# Patient Record
Sex: Female | Born: 1965 | Race: White | Hispanic: No | Marital: Married | Smoking: Never smoker
Health system: Southern US, Community
[De-identification: ages and names within clinical notes are randomized; demographics above are authoritative.]

## PROBLEM LIST (undated history)

## (undated) DIAGNOSIS — F419 Anxiety disorder, unspecified: Secondary | ICD-10-CM

## (undated) DIAGNOSIS — K449 Diaphragmatic hernia without obstruction or gangrene: Secondary | ICD-10-CM

## (undated) DIAGNOSIS — L719 Rosacea, unspecified: Secondary | ICD-10-CM

## (undated) DIAGNOSIS — F32A Depression, unspecified: Secondary | ICD-10-CM

## (undated) DIAGNOSIS — E063 Autoimmune thyroiditis: Secondary | ICD-10-CM

## (undated) DIAGNOSIS — K219 Gastro-esophageal reflux disease without esophagitis: Secondary | ICD-10-CM

## (undated) DIAGNOSIS — M1990 Unspecified osteoarthritis, unspecified site: Secondary | ICD-10-CM

## (undated) DIAGNOSIS — R51 Headache: Secondary | ICD-10-CM

## (undated) DIAGNOSIS — E039 Hypothyroidism, unspecified: Secondary | ICD-10-CM

## (undated) DIAGNOSIS — F329 Major depressive disorder, single episode, unspecified: Secondary | ICD-10-CM

## (undated) DIAGNOSIS — K222 Esophageal obstruction: Secondary | ICD-10-CM

## (undated) DIAGNOSIS — K589 Irritable bowel syndrome without diarrhea: Secondary | ICD-10-CM

## (undated) HISTORY — DX: Irritable bowel syndrome, unspecified: K58.9

## (undated) HISTORY — DX: Headache: R51

## (undated) HISTORY — DX: Unspecified osteoarthritis, unspecified site: M19.90

## (undated) HISTORY — DX: Depression, unspecified: F32.A

## (undated) HISTORY — DX: Hypothyroidism, unspecified: E03.9

## (undated) HISTORY — DX: Esophageal obstruction: K22.2

## (undated) HISTORY — DX: Diaphragmatic hernia without obstruction or gangrene: K44.9

## (undated) HISTORY — PX: NO PAST SURGERIES: SHX2092

## (undated) HISTORY — DX: Rosacea, unspecified: L71.9

## (undated) HISTORY — DX: Autoimmune thyroiditis: E06.3

## (undated) HISTORY — DX: Major depressive disorder, single episode, unspecified: F32.9

## (undated) HISTORY — DX: Anxiety disorder, unspecified: F41.9

## (undated) HISTORY — DX: Gastro-esophageal reflux disease without esophagitis: K21.9

---

## 2002-12-06 ENCOUNTER — Other Ambulatory Visit: Admission: RE | Admit: 2002-12-06 | Discharge: 2002-12-06 | Payer: Self-pay | Admitting: Obstetrics and Gynecology

## 2004-01-20 ENCOUNTER — Other Ambulatory Visit: Admission: RE | Admit: 2004-01-20 | Discharge: 2004-01-20 | Payer: Self-pay | Admitting: Obstetrics and Gynecology

## 2004-08-06 ENCOUNTER — Other Ambulatory Visit: Admission: RE | Admit: 2004-08-06 | Discharge: 2004-08-06 | Payer: Self-pay | Admitting: Obstetrics and Gynecology

## 2005-02-19 ENCOUNTER — Inpatient Hospital Stay (HOSPITAL_COMMUNITY): Admission: AD | Admit: 2005-02-19 | Discharge: 2005-02-21 | Payer: Self-pay | Admitting: Obstetrics and Gynecology

## 2005-04-02 ENCOUNTER — Other Ambulatory Visit: Admission: RE | Admit: 2005-04-02 | Discharge: 2005-04-02 | Payer: Self-pay | Admitting: Obstetrics and Gynecology

## 2005-09-06 ENCOUNTER — Encounter: Admission: RE | Admit: 2005-09-06 | Discharge: 2005-10-06 | Payer: Self-pay | Admitting: Orthopedic Surgery

## 2011-04-22 ENCOUNTER — Other Ambulatory Visit: Payer: Self-pay | Admitting: Obstetrics and Gynecology

## 2011-04-22 DIAGNOSIS — R928 Other abnormal and inconclusive findings on diagnostic imaging of breast: Secondary | ICD-10-CM

## 2011-04-28 ENCOUNTER — Ambulatory Visit
Admission: RE | Admit: 2011-04-28 | Discharge: 2011-04-28 | Disposition: A | Discharge status: Home / Self Care | Payer: 59 | Source: Ambulatory Visit | Attending: Obstetrics and Gynecology | Admitting: Obstetrics and Gynecology

## 2011-04-28 DIAGNOSIS — R928 Other abnormal and inconclusive findings on diagnostic imaging of breast: Secondary | ICD-10-CM

## 2011-04-29 ENCOUNTER — Ambulatory Visit (INDEPENDENT_AMBULATORY_CARE_PROVIDER_SITE_OTHER): Payer: 59 | Admitting: Internal Medicine

## 2011-04-29 DIAGNOSIS — F411 Generalized anxiety disorder: Secondary | ICD-10-CM

## 2011-04-30 ENCOUNTER — Encounter: Payer: Self-pay | Admitting: Internal Medicine

## 2011-06-17 ENCOUNTER — Ambulatory Visit (INDEPENDENT_AMBULATORY_CARE_PROVIDER_SITE_OTHER): Payer: 59 | Admitting: Internal Medicine

## 2011-06-17 ENCOUNTER — Encounter: Payer: Self-pay | Admitting: Internal Medicine

## 2011-06-17 VITALS — BP 102/64 | HR 80 | Temp 97.7°F | Resp 16 | Ht 64.5 in | Wt 127.0 lb

## 2011-06-17 DIAGNOSIS — F32A Depression, unspecified: Secondary | ICD-10-CM

## 2011-06-17 DIAGNOSIS — E039 Hypothyroidism, unspecified: Secondary | ICD-10-CM | POA: Insufficient documentation

## 2011-06-17 DIAGNOSIS — L719 Rosacea, unspecified: Secondary | ICD-10-CM | POA: Insufficient documentation

## 2011-06-17 DIAGNOSIS — F3289 Other specified depressive episodes: Secondary | ICD-10-CM

## 2011-06-17 DIAGNOSIS — F419 Anxiety disorder, unspecified: Secondary | ICD-10-CM | POA: Insufficient documentation

## 2011-06-17 DIAGNOSIS — F329 Major depressive disorder, single episode, unspecified: Secondary | ICD-10-CM

## 2011-06-17 DIAGNOSIS — R519 Headache, unspecified: Secondary | ICD-10-CM | POA: Insufficient documentation

## 2011-06-17 DIAGNOSIS — R51 Headache: Secondary | ICD-10-CM | POA: Insufficient documentation

## 2011-06-17 DIAGNOSIS — G43909 Migraine, unspecified, not intractable, without status migrainosus: Secondary | ICD-10-CM

## 2011-06-17 MED ORDER — IBUPROFEN 800 MG PO TABS: ORAL_TABLET | ORAL | Status: DC

## 2011-06-17 NOTE — Progress Notes (Signed)
  Subjective:    Patient ID: Laura Mann, female    DOB: 04-07-1966, 45 y.o.   MRN: [id number]  HPI  Laura Mann states she is doing much better.  Improved stressors at home.  73 yo daughter with brain tumor had a good MRI report and does not need further chemo treatments now.  Improved relations with husband .   She saw [name] who increased her Effexor to 75 mg daily.  Sleeping better, feeling calmer.  No suicidal or homicidal ideation.  She is of the Clonazepam and last used Xanax 2 weeks ago.  Still has occasional migraine headaches perimenstrually.  No visual or speech changes during episodes, no numbness or motor weakness.  Pt reports MRI done by [name] was normal  Allergies  Allergen Reactions  . Codeine Other (See Comments)    dizziness   Past Medical History  Diagnosis Date  . Headache     migraine  . Rosacea   . Depression   . Hypothyroidism   . Hashimoto thyroiditis   . Anxiety    Past Surgical History  Procedure Date  . No past surgeries    History   Social History  . Marital Status: Married    Spouse Name: [name]    Number of Children: 2  . Years of Education: Master's   Occupational History  . Occupational therapist    Social History Main Topics  . Smoking status: Never Smoker   . Smokeless tobacco: Never Used  . Alcohol Use: No  . Drug Use: No  . Sexually Active: Yes -- Female partner(s)   Other Topics Concern  . Not on file   Social History Narrative  . No narrative on file   Family History  Problem Relation Age of Onset  . Depression Father   . Melanoma Father   . Heart disease Father   . Diabetes Father   . Melanoma Mother   . Stroke Maternal Grandfather   . Cancer Maternal Grandmother     Uterine   Patient Active Problem List  Diagnoses  . Anxiety  . Headache  . Depression  . Rosacea  . Hypothyroidism          Review of Systems  See HPI     Objective:   Physical Exam  Nursing note and vitals  reviewed. Constitutional: She is oriented to person, place, and time. She appears well-developed and well-nourished.  HENT:  Head: Normocephalic and atraumatic.  Cardiovascular: Normal rate and regular rhythm.  Exam reveals no gallop and no friction rub.   No murmur heard. Pulmonary/Chest: Breath sounds normal. She has no wheezes. She has no rales.  Neurological: She is alert and oriented to person, place, and time.  Skin: Skin is warm and dry.  Psychiatric: She has a normal mood and affect. Her behavior is normal.          Assessment & Plan:  1) Depression  Pt to continue with Dr. [name] treatment of Effexor and Xanax as needed.  Continue with therapist  She is markedly improved today  2)  Migraine Headache  OK to take Motrin 800 mg 2 days prior to menses and contineu throughout cycle.  She is aware to only use 2 tryptans in a 24 hour period.

## 2011-06-19 ENCOUNTER — Encounter: Payer: Self-pay | Admitting: Internal Medicine

## 2011-07-05 ENCOUNTER — Other Ambulatory Visit: Payer: Self-pay | Admitting: Internal Medicine

## 2011-07-05 DIAGNOSIS — E039 Hypothyroidism, unspecified: Secondary | ICD-10-CM

## 2011-07-05 NOTE — Telephone Encounter (Signed)
Pt would like refill RX for Levothyroxine .088 mg.  [facility] number [phone] & fax [phone].  Call back number for pt [phone].

## 2011-07-06 MED ORDER — LEVOTHYROXINE SODIUM 88 MCG PO TABS: 88.0000 ug | ORAL_TABLET | Freq: Every day | ORAL | Status: DC

## 2011-07-06 NOTE — Telephone Encounter (Signed)
Okay to refill levothyroxine 90 day supply to [facility]?  I authorized 1 RF

## 2011-08-03 ENCOUNTER — Encounter: Payer: Self-pay | Admitting: Internal Medicine

## 2011-08-03 ENCOUNTER — Ambulatory Visit (INDEPENDENT_AMBULATORY_CARE_PROVIDER_SITE_OTHER): Payer: 59 | Admitting: Internal Medicine

## 2011-08-03 DIAGNOSIS — G43909 Migraine, unspecified, not intractable, without status migrainosus: Secondary | ICD-10-CM

## 2011-08-03 LAB — CBC WITH DIFFERENTIAL/PLATELET
Basophils Absolute: 0 10*3/uL (ref 0.0–0.1)
Basophils Relative: 0 % (ref 0–1)
Eosinophils Absolute: 0 10*3/uL (ref 0.0–0.7)
Eosinophils Relative: 0 % (ref 0–5)
HCT: 37.8 % (ref 36.0–46.0)
Hemoglobin: 12.1 g/dL (ref 12.0–15.0)
Lymphocytes Relative: 26 % (ref 12–46)
Lymphs Abs: 1.3 10*3/uL (ref 0.7–4.0)
MCH: 28.9 pg (ref 26.0–34.0)
MCHC: 32 g/dL (ref 30.0–36.0)
MCV: 90.2 fL (ref 78.0–100.0)
Monocytes Absolute: 0.3 10*3/uL (ref 0.1–1.0)
Monocytes Relative: 7 % (ref 3–12)
Neutro Abs: 3.5 10*3/uL (ref 1.7–7.7)
Neutrophils Relative %: 68 % (ref 43–77)
Platelets: 211 10*3/uL (ref 150–400)
RBC: 4.19 MIL/uL (ref 3.87–5.11)
RDW: 13.2 % (ref 11.5–15.5)
WBC: 5.2 10*3/uL (ref 4.0–10.5)

## 2011-08-03 LAB — LIPID PANEL
Cholesterol: 152 mg/dL (ref 0–200)
HDL: 64 mg/dL (ref >39)
LDL Cholesterol: 79 mg/dL (ref 0–99)
Total CHOL/HDL Ratio: 2.4 Ratio
Triglycerides: 43 mg/dL (ref <150)
VLDL: 9 mg/dL (ref 0–40)

## 2011-08-03 LAB — COMPREHENSIVE METABOLIC PANEL
ALT: 20 U/L (ref 0–35)
AST: 19 U/L (ref 0–37)
Albumin: 4.6 g/dL (ref 3.5–5.2)
Alkaline Phosphatase: 56 U/L (ref 39–117)
BUN: 15 mg/dL (ref 6–23)
CO2: 31 mEq/L (ref 19–32)
Calcium: 9.5 mg/dL (ref 8.4–10.5)
Chloride: 100 mEq/L (ref 96–112)
Creat: 0.64 mg/dL (ref 0.50–1.10)
Glucose, Bld: 103 mg/dL — ABNORMAL HIGH (ref 70–99)
Potassium: 4.1 mEq/L (ref 3.5–5.3)
Sodium: 140 mEq/L (ref 135–145)
Total Bilirubin: 1 mg/dL (ref 0.3–1.2)
Total Protein: 7 g/dL (ref 6.0–8.3)

## 2011-08-03 NOTE — Progress Notes (Signed)
Subjective:    Patient ID: Laura Mann, female    DOB: [birth year], 45 y.o.   MRN: [id number]  HPI  Laura Mann is here for follow up on her migraines and she needs form filled out for insurance discount.  She is doing well.  Migraine headaches much less frequent last month, she only required one imitrex.  Laura Mann her daughter is in first grade now and adjusting.  She is on a higher Effexor dose now and is helping  Allergies  Allergen Reactions  . Codeine Other (See Comments)    dizziness   Past Medical History  Diagnosis Date  . Headache     migraine  . Rosacea   . Depression   . Hypothyroidism   . Hashimoto thyroiditis   . Anxiety    Past Surgical History  Procedure Date  . No past surgeries    History   Social History  . Marital Status: Married    Spouse Name: [name]    Number of Children: 2  . Years of Education: Master's   Occupational History  . Occupational therapist    Social History Main Topics  . Smoking status: Never Smoker   . Smokeless tobacco: Never Used  . Alcohol Use: No  . Drug Use: No  . Sexually Active: Yes -- Female partner(s)   Other Topics Concern  . Not on file   Social History Narrative  . No narrative on file   Family History  Problem Relation Age of Onset  . Depression Father   . Melanoma Father   . Heart disease Father   . Diabetes Father   . Melanoma Mother   . Stroke Maternal Grandfather   . Cancer Maternal Grandmother     Uterine   Patient Active Problem List  Diagnoses  . Anxiety  . Headache  . Depression  . Rosacea  . Hypothyroidism   Current Outpatient Prescriptions on File Prior to Visit  Medication Sig Dispense Refill  . ALPRAZolam (XANAX) 0.25 MG tablet Take 0.25 mg by mouth at bedtime as needed.        . diclofenac (VOLTAREN) 75 MG EC tablet Take 75 mg by mouth 2 (two) times daily as needed.        Marland Kitchen ibuprofen (ADVIL,MOTRIN) 800 MG tablet Take one tablet twice a day prior to menses  30 tablet  1  .  levothyroxine (SYNTHROID, LEVOTHROID) 88 MCG tablet Take 1 tablet (88 mcg total) by mouth daily.  90 tablet  1  . metroNIDAZOLE (METROGEL) 1 % gel Apply 1 application topically daily. To face       . SUMAtriptan (IMITREX) 100 MG tablet Take 100 mg by mouth every 2 (two) hours as needed.        . venlafaxine (EFFEXOR) 75 MG tablet Take 75 mg by mouth daily.       Marland Kitchen esomeprazole (NEXIUM) 40 MG capsule Take 40 mg by mouth daily before breakfast.             Review of Systems    See HPI.  No chest pain or SOB  Objective:   Physical Exam Physical Exam  Nursing note and vitals reviewed.  Constitutional: She is oriented to person, place, and time. She appears well-developed and well-nourished.  HENT:  Head: Normocephalic and atraumatic.  Cardiovascular: Normal rate and regular rhythm. Exam reveals no gallop and no friction rub.  No murmur heard.  Pulmonary/Chest: Breath sounds normal. She has no wheezes. She has no rales.  Neurological:  She is alert and oriented to person, place, and time.  Skin: Skin is warm and dry.  Psychiatric: She has a normal mood and affect. Her behavior is normal.       Assessment & Plan:  1)  Migraine  Improved with higher Effexor dose  Needs CPE

## 2011-08-04 ENCOUNTER — Encounter: Payer: Self-pay | Admitting: Emergency Medicine

## 2011-09-20 ENCOUNTER — Ambulatory Visit: Payer: 59

## 2011-10-05 ENCOUNTER — Ambulatory Visit (INDEPENDENT_AMBULATORY_CARE_PROVIDER_SITE_OTHER): Payer: 59 | Admitting: Emergency Medicine

## 2011-10-05 VITALS — BP 103/63 | HR 83 | Temp 97.7°F | Resp 12 | Ht 64.5 in | Wt 132.0 lb

## 2011-10-05 DIAGNOSIS — Z23 Encounter for immunization: Secondary | ICD-10-CM

## 2011-12-30 ENCOUNTER — Other Ambulatory Visit: Payer: Self-pay | Admitting: Internal Medicine

## 2012-03-20 ENCOUNTER — Ambulatory Visit: Payer: 59 | Admitting: Internal Medicine

## 2012-04-06 ENCOUNTER — Ambulatory Visit (INDEPENDENT_AMBULATORY_CARE_PROVIDER_SITE_OTHER): Payer: 59 | Admitting: Internal Medicine

## 2012-04-06 ENCOUNTER — Encounter: Payer: Self-pay | Admitting: Internal Medicine

## 2012-04-06 VITALS — BP 102/60 | HR 81 | Temp 97.4°F | Resp 16 | Ht 64.0 in | Wt 145.0 lb

## 2012-04-06 DIAGNOSIS — J329 Chronic sinusitis, unspecified: Secondary | ICD-10-CM

## 2012-04-06 DIAGNOSIS — J309 Allergic rhinitis, unspecified: Secondary | ICD-10-CM

## 2012-04-06 MED ORDER — CEFTRIAXONE SODIUM 250 MG IJ SOLR: 500.0000 mg | Freq: Once | INTRAMUSCULAR | Status: AC

## 2012-04-06 MED ORDER — CEFTRIAXONE SODIUM 1 G IJ SOLR
500.0000 mg | Freq: Once | INTRAMUSCULAR | Status: AC
  Administered 2012-04-06: 500 mg via INTRAMUSCULAR

## 2012-04-06 MED ORDER — ALBUTEROL SULFATE HFA 108 (90 BASE) MCG/ACT IN AERS: INHALATION_SPRAY | RESPIRATORY_TRACT | Status: DC

## 2012-04-06 NOTE — Progress Notes (Signed)
Addended by: Granville Lewis on: 04/06/2012 01:34 PM   Modules accepted: Orders

## 2012-04-06 NOTE — Progress Notes (Signed)
Subjective:    Patient ID: Laura Mann, female    DOB: 11/10/1966, 46 y.o.   MRN: [id number]  HPI  [name] is here for acute visit.  Approx 40month ago was treated for sinusitis with Augemtin, also had flonase for nasal allergy.  Was doing better until 6 days ago when had nasal congesiton, runny nose, dry cough and tightness in chest.  Nose itching some.  Cough productive at times of green sputum  No fever or chest pain.  Still feels very full in facial maxillary area.  Is taking Zyrtec and flonase for last 3-4 days  Allergies  Allergen Reactions  . Codeine Other (See Comments)    dizziness   Past Medical History  Diagnosis Date  . Headache     migraine  . Rosacea   . Depression   . Hypothyroidism   . Hashimoto thyroiditis   . Anxiety    Past Surgical History  Procedure Date  . No past surgeries    History   Social History  . Marital Status: Married    Spouse Name: [name]    Number of Children: 2  . Years of Education: Master's   Occupational History  . Occupational therapist    Social History Main Topics  . Smoking status: Never Smoker   . Smokeless tobacco: Never Used  . Alcohol Use: No  . Drug Use: No  . Sexually Active: Yes -- Female partner(s)   Other Topics Concern  . Not on file   Social History Narrative  . No narrative on file   Family History  Problem Relation Age of Onset  . Depression Father   . Melanoma Father   . Heart disease Father   . Diabetes Father   . Melanoma Mother   . Stroke Maternal Grandfather   . Cancer Maternal Grandmother     Uterine   Patient Active Problem List  Diagnoses  . Anxiety  . Headache  . Depression  . Rosacea  . Hypothyroidism   Current Outpatient Prescriptions on File Prior to Visit  Medication Sig Dispense Refill  . fluticasone (FLONASE) 50 MCG/ACT nasal spray       . ibuprofen (ADVIL,MOTRIN) 800 MG tablet Take one tablet twice a day prior to menses  30 tablet  1  . levothyroxine (SYNTHROID,  LEVOTHROID) 88 MCG tablet TAKE 1 TABLET BY MOUTH DAILY  90 tablet  1  . metroNIDAZOLE (METROGEL) 1 % gel Apply 1 application topically daily. To face       . SUMAtriptan (IMITREX) 100 MG tablet Take 100 mg by mouth every 2 (two) hours as needed.        Marland Kitchen albuterol (PROVENTIL HFA;VENTOLIN HFA) 108 (90 BASE) MCG/ACT inhaler Inhale 2 puffs into lungs twice daily  1 Inhaler  0  . ALPRAZolam (XANAX) 0.25 MG tablet Take 0.25 mg by mouth at bedtime as needed.        . diclofenac (VOLTAREN) 75 MG EC tablet Take 75 mg by mouth 2 (two) times daily as needed.        Marland Kitchen esomeprazole (NEXIUM) 40 MG capsule Take 40 mg by mouth daily before breakfast.        . venlafaxine (EFFEXOR) 37.5 MG tablet Take 37.5 mg by mouth daily.        Marland Kitchen venlafaxine (EFFEXOR) 75 MG tablet Take 75 mg by mouth daily.           Review of Systems See HPI    Objective:   Physical Exam  Physical Exam  Constitutional: She is oriented to person, place, and time. She appears well-developed and well-nourished. She is cooperative.  HENT:  Head: Normocephalic and atraumatic.  Right Ear: A middle ear effusion is present.  Left Ear: A middle ear effusion is present.  Nose: Mucosal edema present. Right sinus exhibits maxillary sinus tenderness. Left sinus exhibits maxillary sinus tenderness.  Mouth/Throat: Posterior oropharyngeal erythema present.  Serous effusion bilaterally  Eyes: Conjunctivae and EOM are normal. Pupils are equal, round, and reactive to light.  Neck: Neck supple. Carotid bruit is not present. No mass present.  Cardiovascular: Regular rhythm, normal heart sounds, intact distal pulses and normal pulses. Exam reveals no gallop and no friction rub.  No murmur heard.  Pulmonary/Chest: Breath sounds normal. She has no wheezes. She has no rhonchi. She has no rales.  Neurological: She is alert and oriented to person, place, and time.  Skin: Skin is warm and dry. No abrasion, no bruising, no ecchymosis and no rash noted. No  cyanosis. Nails show no clubbing.  Psychiatric: She has a normal mood and affect. Her speech is normal and behavior is normal.         Assessment & Plan:  1)  Sinusiits recurrent  Will give 500 of Rocephin IM today .  If not improvement in 2 weeks may need sinus CT.  Pt counseled to call if not better.  Also gave albuterol HFA to use 2 inhalations bid for cough and chest tightness 2)  Allergic rhinitis  Use Zyrtec D and flonase.   See me in 2 weeks if not better.  Pt voices understanding

## 2012-04-06 NOTE — Patient Instructions (Signed)
Call if not better in two weeks

## 2012-06-22 ENCOUNTER — Other Ambulatory Visit: Payer: Self-pay | Admitting: *Deleted

## 2012-06-22 ENCOUNTER — Other Ambulatory Visit: Payer: Self-pay | Admitting: Internal Medicine

## 2012-06-22 DIAGNOSIS — E039 Hypothyroidism, unspecified: Secondary | ICD-10-CM

## 2012-06-22 MED ORDER — LEVOTHYROXINE SODIUM 88 MCG PO TABS: 88.0000 ug | ORAL_TABLET | Freq: Every day | ORAL | Status: DC

## 2012-06-22 NOTE — Telephone Encounter (Signed)
[facility]

## 2012-06-22 NOTE — Telephone Encounter (Signed)
Pt needs refill on Levothyroxine Sodium (Tab) SYNTHROID, LEVOTHROID 88 MCG TAKE 1 TABLET BY MOUTH DAILY   [facility] [phone];     Per pt if there are any questions please call her on her cell [phone]

## 2012-07-04 ENCOUNTER — Other Ambulatory Visit: Payer: Self-pay | Admitting: *Deleted

## 2012-07-04 ENCOUNTER — Telehealth: Payer: Self-pay | Admitting: Internal Medicine

## 2012-07-04 DIAGNOSIS — E039 Hypothyroidism, unspecified: Secondary | ICD-10-CM

## 2012-07-04 MED ORDER — LEVOTHYROXINE SODIUM 88 MCG PO TABS: 88.0000 ug | ORAL_TABLET | Freq: Every day | ORAL | Status: DC

## 2012-07-04 NOTE — Telephone Encounter (Signed)
Pt requesting a RF on Synthroid be sent to [facility] for a 30 days supply because her mail order has mess up her order and she only has 3 pills left.  Order sent to pharmacy

## 2012-07-04 NOTE — Telephone Encounter (Signed)
Pt needs a 30 day supply of Levothyroxine Sodium (Tab) SYNTHROID, LEVOTHROID 88 MCG Take 1 tablet (88 mcg total) by mouth daily.   Sent to [facility] on [address].Marland Kitchen [phone]... Only have three pills left... Her mail order was messed up they suggest she called her pcp to send in a 30 day supply until the problem is fixed.... Pt states if there  Are any questions please call her on her cell... [phone]

## 2012-07-18 ENCOUNTER — Encounter: Payer: Self-pay | Admitting: Internal Medicine

## 2012-07-18 ENCOUNTER — Ambulatory Visit (INDEPENDENT_AMBULATORY_CARE_PROVIDER_SITE_OTHER): Payer: 59 | Admitting: Internal Medicine

## 2012-07-18 VITALS — BP 110/74 | HR 99 | Temp 97.0°F | Resp 16 | Ht 64.0 in | Wt 138.0 lb

## 2012-07-18 DIAGNOSIS — F329 Major depressive disorder, single episode, unspecified: Secondary | ICD-10-CM

## 2012-07-18 DIAGNOSIS — F32A Depression, unspecified: Secondary | ICD-10-CM

## 2012-07-18 DIAGNOSIS — R51 Headache: Secondary | ICD-10-CM

## 2012-07-18 DIAGNOSIS — E039 Hypothyroidism, unspecified: Secondary | ICD-10-CM

## 2012-07-18 DIAGNOSIS — Z0000 Encounter for general adult medical examination without abnormal findings: Secondary | ICD-10-CM

## 2012-07-18 DIAGNOSIS — F3289 Other specified depressive episodes: Secondary | ICD-10-CM

## 2012-07-18 DIAGNOSIS — J309 Allergic rhinitis, unspecified: Secondary | ICD-10-CM

## 2012-07-18 LAB — POCT URINALYSIS DIPSTICK
Bilirubin, UA: NEGATIVE
Blood, UA: NEGATIVE
Glucose, UA: NEGATIVE
Ketones, UA: NEGATIVE
Leukocytes, UA: NEGATIVE
Nitrite, UA: NEGATIVE
Protein, UA: NEGATIVE
Spec Grav, UA: <=1.005
Urobilinogen, UA: NEGATIVE
pH, UA: 7.5

## 2012-07-18 LAB — LIPID PANEL
Cholesterol: 166 mg/dL (ref 0–200)
HDL: 59 mg/dL (ref >39)
LDL Cholesterol: 97 mg/dL (ref 0–99)
Total CHOL/HDL Ratio: 2.8 Ratio
Triglycerides: 52 mg/dL (ref <150)
VLDL: 10 mg/dL (ref 0–40)

## 2012-07-18 LAB — CBC WITH DIFFERENTIAL/PLATELET
Basophils Absolute: 0 10*3/uL (ref 0.0–0.1)
Basophils Relative: 0 % (ref 0–1)
Eosinophils Absolute: 0 10*3/uL (ref 0.0–0.7)
Eosinophils Relative: 1 % (ref 0–5)
HCT: 33.9 % — ABNORMAL LOW (ref 36.0–46.0)
Hemoglobin: 11.6 g/dL — ABNORMAL LOW (ref 12.0–15.0)
Lymphocytes Relative: 34 % (ref 12–46)
Lymphs Abs: 1.4 10*3/uL (ref 0.7–4.0)
MCH: 28.2 pg (ref 26.0–34.0)
MCHC: 34.2 g/dL (ref 30.0–36.0)
MCV: 82.3 fL (ref 78.0–100.0)
Monocytes Absolute: 0.3 10*3/uL (ref 0.1–1.0)
Monocytes Relative: 8 % (ref 3–12)
Neutro Abs: 2.3 10*3/uL (ref 1.7–7.7)
Neutrophils Relative %: 57 % (ref 43–77)
Platelets: 252 10*3/uL (ref 150–400)
RBC: 4.12 MIL/uL (ref 3.87–5.11)
RDW: 13.8 % (ref 11.5–15.5)
WBC: 4 10*3/uL (ref 4.0–10.5)

## 2012-07-18 LAB — TSH: TSH: 0.666 u[IU]/mL (ref 0.350–4.500)

## 2012-07-18 MED ORDER — LEVOTHYROXINE SODIUM 88 MCG PO TABS: 88.0000 ug | ORAL_TABLET | Freq: Every day | ORAL | Status: DC

## 2012-07-18 NOTE — Patient Instructions (Addendum)
See me as needed 

## 2012-07-18 NOTE — Progress Notes (Signed)
Subjective:    Patient ID: Laura Mann, female    DOB: Apr 12, 1966, 46 y.o.   MRN: [id number]  HPI Allyna is here for comprehensive eval.  Stress is greatly reduced in home as [name] her daughter is doing well and started second grade at [employer].  Anxiety much less  All sinus symptoms now resolved.  No itching or sneezing  She is utd with pap and mammogram from her GYN:  Dr. Marcelle Overlie  Allergies  Allergen Reactions  . Codeine Other (See Comments)    dizziness   Past Medical History  Diagnosis Date  . Headache     migraine  . Rosacea   . Depression   . Hypothyroidism   . Hashimoto thyroiditis   . Anxiety    Past Surgical History  Procedure Date  . No past surgeries    History   Social History  . Marital Status: Married    Spouse Name: [name]    Number of Children: 2  . Years of Education: Master's   Occupational History  . Occupational therapist    Social History Main Topics  . Smoking status: Never Smoker   . Smokeless tobacco: Never Used  . Alcohol Use: No  . Drug Use: No  . Sexually Active: Yes -- Female partner(s)   Other Topics Concern  . Not on file   Social History Narrative  . No narrative on file   Family History  Problem Relation Age of Onset  . Depression Father   . Melanoma Father   . Heart disease Father   . Diabetes Father   . Melanoma Mother   . Stroke Maternal Grandfather   . Cancer Maternal Grandmother     Uterine   Patient Active Problem List  Diagnosis  . Anxiety  . Headache  . Depression  . Rosacea  . Hypothyroidism  . Allergic rhinitis   Current Outpatient Prescriptions on File Prior to Visit  Medication Sig Dispense Refill  . ibuprofen (ADVIL,MOTRIN) 800 MG tablet Take one tablet twice a day prior to menses  30 tablet  1  . levothyroxine (SYNTHROID, LEVOTHROID) 88 MCG tablet Take 1 tablet (88 mcg total) by mouth daily.  90 tablet  0  . metroNIDAZOLE (METROGEL) 1 % gel Apply 1 application topically daily. To  face       . SUMAtriptan (IMITREX) 100 MG tablet Take 100 mg by mouth every 2 (two) hours as needed.             Review of Systems  All other systems reviewed and are negative.       Objective:   Physical Exam Physical Exam  Nursing note and vitals reviewed.  Constitutional: She is oriented to person, place, and time. She appears well-developed and well-nourished.  HENT:  Head: Normocephalic and atraumatic.  Right Ear: Tympanic membrane and ear canal normal. No drainage. Tympanic membrane is not injected and not erythematous.  Left Ear: Tympanic membrane and ear canal normal. No drainage. Tympanic membrane is not injected and not erythematous.  Nose: Nose normal. Right sinus exhibits no maxillary sinus tenderness and no frontal sinus tenderness. Left sinus exhibits no maxillary sinus tenderness and no frontal sinus tenderness.  Mouth/Throat: Oropharynx is clear and moist. No oral lesions. No oropharyngeal exudate.  Eyes: Conjunctivae and EOM are normal. Pupils are equal, round, and reactive to light.  Neck: Normal range of motion. Neck supple. No JVD present. Carotid bruit is not present. No mass and no thyromegaly present.  Cardiovascular: Normal rate, regular rhythm, S1 normal, S2 normal and intact distal pulses. Exam reveals no gallop and no friction rub.  No murmur heard.  Pulses:  Carotid pulses are 2+ on the right side, and 2+ on the left side.  Dorsalis pedis pulses are 2+ on the right side, and 2+ on the left side.  No carotid bruit. No LE edema  Pulmonary/Chest: Breath sounds normal. She has no wheezes. She has no rales. She exhibits no tenderness.  Abdominal: Soft. Bowel sounds are normal. She exhibits no distension and no mass. There is no hepatosplenomegaly. There is no tenderness. There is no CVA tenderness.  Musculoskeletal: Normal range of motion.  No active synovitis to joints.  Lymphadenopathy:  She has no cervical adenopathy.  She has no axillary adenopathy.    Right: No inguinal and no supraclavicular adenopathy present.  Left: No inguinal and no supraclavicular adenopathy present.  Neurological: She is alert and oriented to person, place, and time. She has normal strength and normal reflexes. She displays no tremor. No cranial nerve deficit or sensory deficit. Coordination and gait normal.  Skin: Skin is warm and dry. No rash noted. No cyanosis. Nails show no clubbing.  Psychiatric: She has a normal mood and affect. Her speech is normal and behavior is normal. Cognition and memory are normal.           Assessment & Plan:  Health Maintenance: See scanned sheet .   Pt to check to see if Tdap given in past.  UTD with pap mammogram   Migraine  Continue current meds with Imitrex  Hypothyroidism  Will check TSH today Continue current med.  Allergic rhinitis  Continue with OTC as needed  Anxiety improving

## 2012-07-19 LAB — COMPLETE METABOLIC PANEL WITH GFR
ALT: 11 U/L (ref 0–35)
AST: 16 U/L (ref 0–37)
Albumin: 4.4 g/dL (ref 3.5–5.2)
Alkaline Phosphatase: 52 U/L (ref 39–117)
BUN: 15 mg/dL (ref 6–23)
CO2: 28 mEq/L (ref 19–32)
Calcium: 9.5 mg/dL (ref 8.4–10.5)
Chloride: 102 mEq/L (ref 96–112)
Creat: 0.63 mg/dL (ref 0.50–1.10)
GFR, Est African American: >89 mL/min
GFR, Est Non African American: >89 mL/min
Glucose, Bld: 80 mg/dL (ref 70–99)
Potassium: 4.1 mEq/L (ref 3.5–5.3)
Sodium: 138 mEq/L (ref 135–145)
Total Bilirubin: 1.3 mg/dL — ABNORMAL HIGH (ref 0.3–1.2)
Total Protein: 6.8 g/dL (ref 6.0–8.3)

## 2012-07-19 LAB — VITAMIN D 25 HYDROXY (VIT D DEFICIENCY, FRACTURES): Vit D, 25-Hydroxy: 34 ng/mL (ref 30–89)

## 2012-07-20 ENCOUNTER — Telehealth: Payer: Self-pay | Admitting: Internal Medicine

## 2012-07-20 NOTE — Telephone Encounter (Signed)
  Spoke with pt and informed of mild anemia.  She states she has been having heavy menses last few months.  Advised to take OTC iron qod  She voices understanding

## 2012-07-20 NOTE — Telephone Encounter (Signed)
Left message for pt to call regarding her bloodwork

## 2012-07-25 ENCOUNTER — Encounter: Payer: Self-pay | Admitting: *Deleted

## 2012-08-11 ENCOUNTER — Encounter: Payer: Self-pay | Admitting: *Deleted

## 2012-08-31 ENCOUNTER — Encounter: Payer: Self-pay | Admitting: *Deleted

## 2012-09-25 ENCOUNTER — Ambulatory Visit: Payer: 59

## 2012-09-27 ENCOUNTER — Ambulatory Visit (INDEPENDENT_AMBULATORY_CARE_PROVIDER_SITE_OTHER): Payer: 59 | Admitting: *Deleted

## 2012-09-27 DIAGNOSIS — Z23 Encounter for immunization: Secondary | ICD-10-CM

## 2012-10-11 ENCOUNTER — Other Ambulatory Visit: Payer: Self-pay | Admitting: Internal Medicine

## 2012-10-11 NOTE — Telephone Encounter (Signed)
Needs refill

## 2012-11-22 DIAGNOSIS — K222 Esophageal obstruction: Secondary | ICD-10-CM

## 2012-11-22 HISTORY — DX: Esophageal obstruction: K22.2

## 2013-04-02 ENCOUNTER — Ambulatory Visit: Payer: 59 | Admitting: Internal Medicine

## 2013-04-04 ENCOUNTER — Ambulatory Visit (INDEPENDENT_AMBULATORY_CARE_PROVIDER_SITE_OTHER): Payer: 59 | Admitting: Internal Medicine

## 2013-04-04 ENCOUNTER — Encounter: Payer: Self-pay | Admitting: Internal Medicine

## 2013-04-04 VITALS — BP 100/62 | HR 79 | Temp 98.0°F | Resp 18 | Wt 133.0 lb

## 2013-04-04 DIAGNOSIS — J309 Allergic rhinitis, unspecified: Secondary | ICD-10-CM

## 2013-04-04 DIAGNOSIS — G43109 Migraine with aura, not intractable, without status migrainosus: Secondary | ICD-10-CM | POA: Insufficient documentation

## 2013-04-04 DIAGNOSIS — G43909 Migraine, unspecified, not intractable, without status migrainosus: Secondary | ICD-10-CM

## 2013-04-04 MED ORDER — FLUTICASONE PROPIONATE 50 MCG/ACT NA SUSP: 2.0000 | Freq: Every day | NASAL | Status: DC

## 2013-04-04 MED ORDER — SUMATRIPTAN SUCCINATE 100 MG PO TABS: ORAL_TABLET | ORAL | Status: DC

## 2013-04-04 NOTE — Patient Instructions (Addendum)
Schedule CPE

## 2013-04-04 NOTE — Progress Notes (Signed)
  Subjective:    Patient ID: Laura Mann, female    DOB: 11/05/1966, 47 y.o.   MRN: [id number]  HPI Laura Mann is here for follow up.  She is here with her daughter Seward Grater who was just recently diagnosed with Type I diabetes.  Seward Grater has just been approved for a pump  Aalaiyah tells me her neurologist is retiring and needs more Imitrex.    She has rare migraine.  Allergies are acting up.  Flonase helped in the past  Allergies  Allergen Reactions  . Codeine Other (See Comments)    dizziness   Past Medical History  Diagnosis Date  . Headache     migraine  . Rosacea   . Depression   . Hypothyroidism   . Hashimoto thyroiditis   . Anxiety    Past Surgical History  Procedure Laterality Date  . No past surgeries     History   Social History  . Marital Status: Married    Spouse Name: [name]    Number of Children: 2  . Years of Education: Master's   Occupational History  . Occupational therapist    Social History Main Topics  . Smoking status: Never Smoker   . Smokeless tobacco: Never Used  . Alcohol Use: No  . Drug Use: No  . Sexually Active: Yes -- Female partner(s)   Other Topics Concern  . Not on file   Social History Narrative  . No narrative on file   Family History  Problem Relation Age of Onset  . Depression Father   . Melanoma Father   . Heart disease Father   . Diabetes Father   . Melanoma Mother   . Stroke Maternal Grandfather   . Cancer Maternal Grandmother     Uterine   Patient Active Problem List   Diagnosis Date Noted  . Allergic rhinitis 07/18/2012  . Anxiety   . Headache   . Depression   . Rosacea   . Hypothyroidism    Current Outpatient Prescriptions on File Prior to Visit  Medication Sig Dispense Refill  . ibuprofen (ADVIL,MOTRIN) 800 MG tablet Take one tablet twice a day prior to menses  30 tablet  1  . levothyroxine (SYNTHROID, LEVOTHROID) 88 MCG tablet TAKE 1 TABLET DAILY  90 tablet  3  . metroNIDAZOLE (METROGEL) 1 % gel Apply 1  application topically daily. To face       . SUMAtriptan (IMITREX) 100 MG tablet Take 100 mg by mouth every 2 (two) hours as needed.         No current facility-administered medications on file prior to visit.      Review of Systems    See HPI Objective:   Physical Exam  Physical Exam  Nursing note and vitals reviewed.  Constitutional: She is oriented to person, place, and time. She appears well-developed and well-nourished.  HENT:  Head: Normocephalic and atraumatic.  Cardiovascular: Normal rate and regular rhythm. Exam reveals no gallop and no friction rub.  No murmur heard.  Pulmonary/Chest: Breath sounds normal. She has no wheezes. She has no rales.  Neurological: She is alert and oriented to person, place, and time.  Skin: Skin is warm and dry.  Psychiatric: She has a normal mood and affect. Her behavior is normal.            Assessment & Plan:  Migraine   Will reorder her Imitrex  Allergic rhinitis will reorder  flonase  Schedule CPE

## 2013-07-16 ENCOUNTER — Encounter: Payer: Self-pay | Admitting: Gastroenterology

## 2013-07-21 ENCOUNTER — Other Ambulatory Visit: Payer: Self-pay | Admitting: Internal Medicine

## 2013-07-24 NOTE — Telephone Encounter (Signed)
Refill request

## 2013-07-26 ENCOUNTER — Ambulatory Visit (INDEPENDENT_AMBULATORY_CARE_PROVIDER_SITE_OTHER): Payer: 59 | Admitting: Gastroenterology

## 2013-07-26 ENCOUNTER — Encounter: Payer: Self-pay | Admitting: Gastroenterology

## 2013-07-26 ENCOUNTER — Other Ambulatory Visit (INDEPENDENT_AMBULATORY_CARE_PROVIDER_SITE_OTHER): Payer: 59

## 2013-07-26 VITALS — BP 102/74 | HR 60 | Ht 64.0 in | Wt 131.0 lb

## 2013-07-26 DIAGNOSIS — K589 Irritable bowel syndrome without diarrhea: Secondary | ICD-10-CM

## 2013-07-26 DIAGNOSIS — R197 Diarrhea, unspecified: Secondary | ICD-10-CM

## 2013-07-26 DIAGNOSIS — R110 Nausea: Secondary | ICD-10-CM

## 2013-07-26 DIAGNOSIS — R109 Unspecified abdominal pain: Secondary | ICD-10-CM

## 2013-07-26 LAB — BASIC METABOLIC PANEL
BUN: 17 mg/dL (ref 6–23)
CO2: 30 mEq/L (ref 19–32)
Calcium: 9.7 mg/dL (ref 8.4–10.5)
Chloride: 105 mEq/L (ref 96–112)
Creatinine, Ser: 0.7 mg/dL (ref 0.4–1.2)
GFR: 92.44 mL/min (ref >60.00)
Glucose, Bld: 75 mg/dL (ref 70–99)
Potassium: 4.2 mEq/L (ref 3.5–5.1)
Sodium: 138 mEq/L (ref 135–145)

## 2013-07-26 LAB — CBC WITH DIFFERENTIAL/PLATELET
Basophils Absolute: 0 10*3/uL (ref 0.0–0.1)
Basophils Relative: 0.6 % (ref 0.0–3.0)
Eosinophils Absolute: 0 10*3/uL (ref 0.0–0.7)
Eosinophils Relative: 1 % (ref 0.0–5.0)
HCT: 36.7 % (ref 36.0–46.0)
Hemoglobin: 12.4 g/dL (ref 12.0–15.0)
Lymphocytes Relative: 30.5 % (ref 12.0–46.0)
Lymphs Abs: 1.2 10*3/uL (ref 0.7–4.0)
MCHC: 33.8 g/dL (ref 30.0–36.0)
MCV: 85.4 fl (ref 78.0–100.0)
Monocytes Absolute: 0.3 10*3/uL (ref 0.1–1.0)
Monocytes Relative: 7.6 % (ref 3.0–12.0)
Neutro Abs: 2.3 10*3/uL (ref 1.4–7.7)
Neutrophils Relative %: 60.3 % (ref 43.0–77.0)
Platelets: 229 10*3/uL (ref 150.0–400.0)
RBC: 4.29 Mil/uL (ref 3.87–5.11)
RDW: 13.6 % (ref 11.5–14.6)
WBC: 3.9 10*3/uL — ABNORMAL LOW (ref 4.5–10.5)

## 2013-07-26 LAB — HEPATIC FUNCTION PANEL
ALT: 18 U/L (ref 0–35)
AST: 19 U/L (ref 0–37)
Albumin: 4.4 g/dL (ref 3.5–5.2)
Alkaline Phosphatase: 54 U/L (ref 39–117)
Bilirubin, Direct: 0.2 mg/dL (ref 0.0–0.3)
Total Bilirubin: 1.3 mg/dL — ABNORMAL HIGH (ref 0.3–1.2)
Total Protein: 7.5 g/dL (ref 6.0–8.3)

## 2013-07-26 LAB — IBC PANEL
Iron: 89 ug/dL (ref 42–145)
Saturation Ratios: 20.2 % (ref 20.0–50.0)
Transferrin: 314.8 mg/dL (ref 212.0–360.0)

## 2013-07-26 LAB — VITAMIN B12: Vitamin B-12: 457 pg/mL (ref 211–911)

## 2013-07-26 LAB — SEDIMENTATION RATE: Sed Rate: 10 mm/hr (ref 0–22)

## 2013-07-26 LAB — TSH: TSH: 0.36 u[IU]/mL (ref 0.35–5.50)

## 2013-07-26 LAB — FOLATE: Folate: 14.3 ng/mL (ref >5.9)

## 2013-07-26 LAB — FERRITIN: Ferritin: 22.4 ng/mL (ref 10.0–291.0)

## 2013-07-26 MED ORDER — NA SULFATE-K SULFATE-MG SULF 17.5-3.13-1.6 GM/177ML PO SOLN: ORAL | Status: DC

## 2013-07-26 MED ORDER — HYOSCYAMINE SULFATE 0.125 MG SL SUBL: 0.1250 mg | SUBLINGUAL_TABLET | SUBLINGUAL | Status: DC | PRN

## 2013-07-26 NOTE — Patient Instructions (Addendum)
  You have been scheduled for an endoscopy and colonoscopy with propofol. Please follow the written instructions given to you at your visit today. Please pick up your prep at the pharmacy within the next 1-3 days. If you use inhalers (even only as needed), please bring them with you on the day of your procedure. Your physician has requested that you go to www.startemmi.com and enter the access code given to you at your visit today. This web site gives a general overview about your procedure. However, you should still follow specific instructions given to you by our office regarding your preparation for the procedure.  Your physician has requested that you go to the basement for lab work before leaving today.  We have sent the following medications to your pharmacy for you to pick up at your convenience: Levsin, please take as directed ________________________________________________________                                               We are excited to introduce MyChart, a new best-in-class service that provides you online access to important information in your electronic medical record. We want to make it easier for you to view your health information - all in one secure location - when and where you need it. We expect MyChart will enhance the quality of care and service we provide.  When you register for MyChart, you can:    View your test results.    Request appointments and receive appointment reminders via email.    Request medication renewals.    View your medical history, allergies, medications and immunizations.    Communicate with your physician's office through a password-protected site.    Conveniently print information such as your medication lists.  To find out if MyChart is right for you, please talk to a member of our clinical staff today. We will gladly answer your questions about this free health and wellness tool.  If you are age 52 or older and want a member of  your family to have access to your record, you must provide written consent by completing a proxy form available at our office. Please speak to our clinical staff about guidelines regarding accounts for patients younger than age 72.  As you activate your MyChart account and need any technical assistance, please call the MyChart technical support line at [phone]) [phone]-CHART [phone]) or email your question to [email or web address]. If you email your question(s), please include your name, a return phone number and the best time to reach you.  If you have non-urgent health-related questions, you can send a message to our office through MyChart at [email or web address]. If you have a medical emergency, call 911.  Thank you for using MyChart as your new health and wellness resource!   MyChart licensed from [employer],  4540-9811. Patents Pending.

## 2013-07-26 NOTE — Progress Notes (Signed)
History of Present Illness:  This is a 47 year old Caucasian female who has had chronic IBS-type complaints for several years.  She has a daughter with apparently biopsy-proven celiac disease and type 1 diabetes.  She has a list of symptoms over the last several years which seem to revolve around intestinal cramping and periodic diarrhea.  The last month she's had muddy stools with lower abdominal cramping, nausea, bloating, heartburn, and generally does not feel well.  She otherwise denies any specific food intolerances.  She's had no significant weight loss.  The patient denies use of sorbitol, fructose, or other nonabsorbable carbohydrates.  Family history is remarkable for colon polyps in her grandmother, otherwise no colon cancer.  She has not had previous colonoscopy , endoscopy, or radiographs.  She has gas bloating, belching, burping but not true reflux symptoms or dysphagia.  She is currently on treatment for thyroid dysfunction and migraine headaches.  She denies abuse of alcohol, cigarettes, or NSAIDs.  She travel out of country many years ago echo door, but had no episodes of dysentery or parasitosis.  I have reviewed this patient's present history, medical and surgical past history, allergies and medications.     ROS:   All systems were reviewed and are negative unless otherwise stated in the HPI.    Physical Exam: Blood pressure 102/74, pulse 60 and regular weight 131 the BMI of 22.47. General well developed well nourished patient in no acute distress, appearing their stated age Eyes PERRLA, no icterus, fundoscopic exam per opthamologist Skin no lesions noted Neck supple, no adenopathy, no thyroid enlargement, no tenderness Chest clear to percussion and auscultation Heart no significant murmurs, gallops or rubs noted Abdomen no hepatosplenomegaly masses or tenderness, BS normal.  Rectal inspection normal no fissures, or fistulae noted.  No masses or tenderness on digital exam. Stool  guaiac negative.  Stool is normal color and consistency. Extremities no acute joint lesions, edema, phlebitis or evidence of cellulitis. Neurologic patient oriented x 3, cranial nerves intact, no focal neurologic deficits noted. Psychological mental status normal and normal affect.  Assessment and plan: Probable IBS exacerbated by caring for a  sick 43-year-old child.  This certainly possible this patient does have celiac disease which is partially treated at this time.  We have ordered celiac serologies, anemia profile, CBC and liver profile.  I gone ahead and scheduled her for endoscopy with small bowel biopsy and colonoscopy exam per family history of colon polyps in her lower gastrointestinal symptoms.  Have prescribed when necessary sublingual Levsin 0.125 mg currently.

## 2013-07-27 ENCOUNTER — Telehealth: Payer: Self-pay | Admitting: Gastroenterology

## 2013-07-27 LAB — CELIAC PANEL 10
Endomysial Screen: NEGATIVE
Gliadin IgA: 5.6 U/mL (ref <20)
Gliadin IgG: 3.2 U/mL (ref <20)
IgA: 146 mg/dL (ref 69–380)
Tissue Transglut Ab: 5.8 U/mL (ref <20)
Tissue Transglutaminase Ab, IgA: 3.4 U/mL (ref <20)

## 2013-07-27 NOTE — Telephone Encounter (Signed)
Pt left me a VM stating she just needs to know if she should be concerned with the black stool and she also reports cramping; states we can leave her a VM. Spoke with pt and informed her since she is having a ECL next week and unless she develops diarrhea with increased dark stools or BRB in her stools, we will see what the procedures show. She should be using Levsin for the cramping and if it's ineffective, she needs to let us know. Pt stated understanding and states the pain really resembles menstrual cramps, but she just finished her period so maybe she needs to see her GYN. She will call for further problems or questions.

## 2013-07-27 NOTE — Telephone Encounter (Signed)
No stool is guaiac-negative when I tested it myself

## 2013-07-27 NOTE — Telephone Encounter (Signed)
Pt here yesterday and reports her stool is still dark today; do you want to order stool samples? Thanks.

## 2013-07-27 NOTE — Telephone Encounter (Signed)
lmom on voice identified cell for pt that Dr Jarold Motto did a stool test yesterday and she is negative for occult blood. Pt will probably call back.

## 2013-08-03 ENCOUNTER — Encounter: Payer: Self-pay | Admitting: Gastroenterology

## 2013-08-03 ENCOUNTER — Ambulatory Visit (AMBULATORY_SURGERY_CENTER): Payer: 59 | Admitting: Gastroenterology

## 2013-08-03 VITALS — BP 109/60 | HR 66 | Temp 98.5°F | Resp 18 | Ht 64.0 in | Wt 131.0 lb

## 2013-08-03 DIAGNOSIS — R109 Unspecified abdominal pain: Secondary | ICD-10-CM

## 2013-08-03 DIAGNOSIS — R197 Diarrhea, unspecified: Secondary | ICD-10-CM

## 2013-08-03 DIAGNOSIS — D1330 Benign neoplasm of unspecified part of small intestine: Secondary | ICD-10-CM

## 2013-08-03 DIAGNOSIS — Z1211 Encounter for screening for malignant neoplasm of colon: Secondary | ICD-10-CM

## 2013-08-03 DIAGNOSIS — D126 Benign neoplasm of colon, unspecified: Secondary | ICD-10-CM

## 2013-08-03 MED ORDER — SODIUM CHLORIDE 0.9 % IV SOLN: 500.0000 mL | INTRAVENOUS | Status: DC

## 2013-08-03 NOTE — Op Note (Signed)
[facility] [address] [place], [address]   COLONOSCOPY PROCEDURE REPORT  PATIENT: Laura Mann, Laura Mann  MR#: [id number] BIRTHDATE: October 08, 1966 , 47  yrs. old GENDER: Female ENDOSCOPIST: Mardella Layman, MD, [facility] REFERRED BY: PROCEDURE DATE:  08/03/2013 PROCEDURE:   Colonoscopy with biopsy First Screening Colonoscopy - Avg.  risk and is 50 yrs.  old or older Yes.      History of Adenoma - Now for follow-up colonoscopy & has been > or = to 3 yrs.  Yes hx of adenoma.  Has been 3 or more years since last colonoscopy.  History of Adenoma - Now for follow-up colonoscopy & has been > or = to 3 yrs.  N/A ASA CLASS:   Class II INDICATIONS:average risk screening. MEDICATIONS: propofol (Diprivan) 250mg  IV  DESCRIPTION OF PROCEDURE:   After the risks benefits and alternatives of the procedure were thoroughly explained, informed consent was obtained.  A digital rectal exam revealed no abnormalities of the rectum.   The LB [id number]  endoscope was introduced through the anus and advanced to the cecum, which was identified by both the appendix and ileocecal valve. No adverse events experienced.   The quality of the prep was excellent, using MoviPrep  The instrument was then slowly withdrawn as the colon was fully examined.      COLON FINDINGS: A normal appearing cecum, ileocecal valve, and appendiceal orifice were identified.  The ascending, hepatic flexure, transverse, splenic flexure, descending, sigmoid colon and rectum appeared unremarkable. Very redundant and tortuous sigmoid colon area. No polyps or cancers were seen.  Multiple random biopsies of the area were performed.     The time to cecum=6 minutes 13 seconds.  Withdrawal time=6 minutes 00 seconds.  The scope was withdrawn and the procedure completed. COMPLICATIONS: There were no complications.  ENDOSCOPIC IMPRESSION: Normal colon; multiple random biopsies of the area were performed ..probable  IBS,r/o microscopic/collagenous colitis...  RECOMMENDATIONS: 1.  Await pathology results 2.  You should continue to follow colorectal cancer screening guidelines for "routine risk" patients with a repeat colonoscopy in 10 years.  There is no need for FOBT (stool) testing for at least 5 years. 3.  Upper endoscopy today  eSigned:  Mardella Layman, MD, [facility] 08/03/2013 2:35 PM   cc:   PATIENT NAME:  Laura Mann, Laura Mann MR#: [id number]

## 2013-08-03 NOTE — Patient Instructions (Addendum)
YOU HAD AN ENDOSCOPIC PROCEDURE TODAY AT THE [facility]: Refer to the procedure report that was given to you for any specific questions about what was found during the examination.  If the procedure report does not answer your questions, please call your gastroenterologist to clarify.  If you requested that your care partner not be given the details of your procedure findings, then the procedure report has been included in a sealed envelope for you to review at your convenience later.  YOU SHOULD EXPECT: Some feelings of bloating in the abdomen. Passage of more gas than usual.  Walking can help get rid of the air that was put into your GI tract during the procedure and reduce the bloating. If you had a lower endoscopy (such as a colonoscopy or flexible sigmoidoscopy) you may notice spotting of blood in your stool or on the toilet paper. If you underwent a bowel prep for your procedure, then you may not have a normal bowel movement for a few days.  DIET: Your first meal following the procedure should be a light meal and then it is ok to progress to your normal diet.  A half-sandwich or bowl of soup is an example of a good first meal.  Heavy or fried foods are harder to digest and may make you feel nauseous or bloated.  Likewise meals heavy in dairy and vegetables can cause extra gas to form and this can also increase the bloating.  Drink plenty of fluids but you should avoid alcoholic beverages for 24 hours.  ACTIVITY: Your care partner should take you home directly after the procedure.  You should plan to take it easy, moving slowly for the rest of the day.  You can resume normal activity the day after the procedure however you should NOT DRIVE or use heavy machinery for 24 hours (because of the sedation medicines used during the test).    SYMPTOMS TO REPORT IMMEDIATELY: A gastroenterologist can be reached at any hour.  During normal business hours, 8:30 AM to 5:00 PM Monday through Friday,  call [phone].  After hours and on weekends, please call the GI answering service at [phone] who will take a message and have the physician on call contact you.   Following lower endoscopy (colonoscopy or flexible sigmoidoscopy):  Excessive amounts of blood in the stool  Significant tenderness or worsening of abdominal pains  Swelling of the abdomen that is new, acute  Fever of 100F or higher  Following upper endoscopy (EGD)  Vomiting of blood or coffee ground material  New chest pain or pain under the shoulder blades  Painful or persistently difficult swallowing  New shortness of breath  Fever of 100F or higher  Black, tarry-looking stools  FOLLOW UP: If any biopsies were taken you will be contacted by phone or by letter within the next 1-3 weeks.  Call your gastroenterologist if you have not heard about the biopsies in 3 weeks.  Our staff will call the home number listed on your records the next business day following your procedure to check on you and address any questions or concerns that you may have at that time regarding the information given to you following your procedure. This is a courtesy call and so if there is no answer at the home number and we have not heard from you through the emergency physician on call, we will assume that you have returned to your regular daily activities without incident.  SIGNATURES/CONFIDENTIALITY: You and/or your care   partner have signed paperwork which will be entered into your electronic medical record.  These signatures attest to the fact that that the information above on your After Visit Summary has been reviewed and is understood.  Full responsibility of the confidentiality of this discharge information lies with you and/or your care-partner.  

## 2013-08-03 NOTE — Progress Notes (Signed)
A/ox3 pleased with MAC, report to Suzanne RN 

## 2013-08-03 NOTE — Progress Notes (Signed)
Called to room to assist during endoscopic procedure.  Patient ID and intended procedure confirmed with present staff. Received instructions for my participation in the procedure from the performing physician.  

## 2013-08-03 NOTE — Progress Notes (Signed)
Patient did not have preoperative order for IV antibiotic SSI prophylaxis. (G8918)  Patient did not experience any of the following events: a burn prior to discharge; a fall within the facility; wrong site/side/patient/procedure/implant event; or a hospital transfer or hospital admission upon discharge from the facility. (G8907)  

## 2013-08-03 NOTE — Op Note (Signed)
[facility] [address] [place], [address]   ENDOSCOPY PROCEDURE REPORT  PATIENT: Laura, Mann  MR#: [id number] BIRTHDATE: 29-May-1966 , 47  yrs. old GENDER: Female ENDOSCOPIST: Mardella Layman, MD, [facility] REFERRED BY: PROCEDURE DATE:  08/03/2013 PROCEDURE:  EGD w/ biopsy for H.pylori and EGD w/ biopsy ASA CLASS:     Class II INDICATIONS: MEDICATIONS: There was residual sedation effect present from prior procedure and propofol (Diprivan) 150mg  IV TOPICAL ANESTHETIC:  DESCRIPTION OF PROCEDURE: After the risks benefits and alternatives of the procedure were thoroughly explained, informed consent was obtained.  The LB [id number] endoscope was introduced through the mouth and advanced to the   . Without limitations.  The instrument was slowly withdrawn as the mucosa was fully examined.        There was flattening of the duodenal mucosal folds noted.  Multiple biopsies were obtained of the second portion of the duodenum and the duodenal bulb.  STOMACH: The mucosa of the stomach appeared normal.   CLO biopsy done of the antral area.  ESOPHAGUS: A Schatzki ring was found. there was a moderate size hiatal hernia but no evidence of acute esophagitis.  Pictures were obtained for documentation.   Retroflexed views revealed a hiatal hernia.     The scope was then withdrawn from the patient and the procedure completed.  COMPLICATIONS: There were no complications. ENDOSCOPIC IMPRESSION: 1.   There was flattening of the duodenal mucosal folds noted. Multiple biopsies were obtained of the second portion of the duodenum and the duodenal bulb.Marland KitchenMarland Kitchen?? treated celiac disease 2.   The mucosa of the stomach appeared normal 3.   Schatzki ring was found 4.  Moderated sized HH and Schatzski's ring at GEJ.  RECOMMENDATIONS: 1.  Await pathology results 2.  Continue current medications  REPEAT EXAM:  eSigned:  Mardella Layman, MD, [facility] 08/03/2013 2:43  PM   CC:  PATIENT NAME:  Laura, Mann MR#: [id number]

## 2013-08-06 ENCOUNTER — Telehealth: Payer: Self-pay

## 2013-08-06 LAB — HELICOBACTER PYLORI SCREEN-BIOPSY: UREASE: NEGATIVE

## 2013-08-06 NOTE — Telephone Encounter (Signed)
Left a message at [phone] for the pt to call if any questions or concerns. Maw

## 2013-08-10 ENCOUNTER — Telehealth: Payer: Self-pay | Admitting: Gastroenterology

## 2013-08-10 NOTE — Telephone Encounter (Signed)
Informed pt of her results and she stated understanding. She has used Levsin for cramping and states it works well. We made a f/u with Dr Jarold Motto for the earliest appt which is 09/07/13; she will call if she feels she needs to be seen earlier.

## 2013-08-10 NOTE — Telephone Encounter (Signed)
lmom for pt to call back

## 2013-08-20 ENCOUNTER — Other Ambulatory Visit: Payer: Self-pay | Admitting: *Deleted

## 2013-08-20 DIAGNOSIS — Z139 Encounter for screening, unspecified: Secondary | ICD-10-CM

## 2013-08-20 LAB — CBC WITH DIFFERENTIAL/PLATELET
Basophils Absolute: 0 10*3/uL (ref 0.0–0.1)
Basophils Relative: 0 % (ref 0–1)
Eosinophils Absolute: 0.1 10*3/uL (ref 0.0–0.7)
Eosinophils Relative: 2 % (ref 0–5)
HCT: 34 % — ABNORMAL LOW (ref 36.0–46.0)
Hemoglobin: 11.5 g/dL — ABNORMAL LOW (ref 12.0–15.0)
Lymphocytes Relative: 34 % (ref 12–46)
Lymphs Abs: 1.3 10*3/uL (ref 0.7–4.0)
MCH: 28.5 pg (ref 26.0–34.0)
MCHC: 33.8 g/dL (ref 30.0–36.0)
MCV: 84.4 fL (ref 78.0–100.0)
Monocytes Absolute: 0.3 10*3/uL (ref 0.1–1.0)
Monocytes Relative: 9 % (ref 3–12)
Neutro Abs: 2.1 10*3/uL (ref 1.7–7.7)
Neutrophils Relative %: 55 % (ref 43–77)
Platelets: 229 10*3/uL (ref 150–400)
RBC: 4.03 MIL/uL (ref 3.87–5.11)
RDW: 14.3 % (ref 11.5–15.5)
WBC: 3.8 10*3/uL — ABNORMAL LOW (ref 4.0–10.5)

## 2013-08-20 LAB — LIPID PANEL
Cholesterol: 128 mg/dL (ref 0–200)
HDL: 52 mg/dL (ref >39)
LDL Cholesterol: 66 mg/dL (ref 0–99)
Total CHOL/HDL Ratio: 2.5 Ratio
Triglycerides: 52 mg/dL (ref <150)
VLDL: 10 mg/dL (ref 0–40)

## 2013-08-20 LAB — COMPREHENSIVE METABOLIC PANEL
ALT: 14 U/L (ref 0–35)
AST: 19 U/L (ref 0–37)
Albumin: 4.2 g/dL (ref 3.5–5.2)
Alkaline Phosphatase: 51 U/L (ref 39–117)
BUN: 15 mg/dL (ref 6–23)
CO2: 29 mEq/L (ref 19–32)
Calcium: 9.1 mg/dL (ref 8.4–10.5)
Chloride: 102 mEq/L (ref 96–112)
Creat: 0.66 mg/dL (ref 0.50–1.10)
Glucose, Bld: 90 mg/dL (ref 70–99)
Potassium: 4 mEq/L (ref 3.5–5.3)
Sodium: 137 mEq/L (ref 135–145)
Total Bilirubin: 1.5 mg/dL — ABNORMAL HIGH (ref 0.3–1.2)
Total Protein: 6.6 g/dL (ref 6.0–8.3)

## 2013-08-21 LAB — VITAMIN D 25 HYDROXY (VIT D DEFICIENCY, FRACTURES): Vit D, 25-Hydroxy: 38 ng/mL (ref 30–89)

## 2013-08-22 ENCOUNTER — Encounter: Payer: Self-pay | Admitting: Gastroenterology

## 2013-09-03 ENCOUNTER — Encounter: Payer: Self-pay | Admitting: *Deleted

## 2013-09-07 ENCOUNTER — Ambulatory Visit (INDEPENDENT_AMBULATORY_CARE_PROVIDER_SITE_OTHER): Payer: 59 | Admitting: Gastroenterology

## 2013-09-07 ENCOUNTER — Encounter: Payer: Self-pay | Admitting: Gastroenterology

## 2013-09-07 VITALS — BP 98/60 | HR 59 | Ht 64.0 in | Wt 130.1 lb

## 2013-09-07 DIAGNOSIS — K589 Irritable bowel syndrome without diarrhea: Secondary | ICD-10-CM

## 2013-09-07 DIAGNOSIS — K219 Gastro-esophageal reflux disease without esophagitis: Secondary | ICD-10-CM

## 2013-09-07 NOTE — Patient Instructions (Addendum)
You watched a movie today about Irritable Bowel Syndrome and information is below for your review Information below on acid reflux   Fodmap diet given today for your review  Please purchase Pepcid AC over the counter and take once daily at bedtime or as needed  ________________________________________________________________________________________________________________________________________________________  Irritable Bowel Syndrome Irritable Bowel Syndrome (IBS) is caused by a disturbance of normal bowel function. Other terms used are spastic colon, mucous colitis, and irritable colon. It does not require surgery, nor does it lead to cancer. There is no cure for IBS. But with proper diet, stress reduction, and medication, you will find that your problems (symptoms) will gradually disappear or improve. IBS is a common digestive disorder. It usually appears in late adolescence or early adulthood. Women develop it twice as often as men. CAUSES  After food has been digested and absorbed in the small intestine, waste material is moved into the colon (large intestine). In the colon, water and salts are absorbed from the undigested products coming from the small intestine. The remaining residue, or fecal material, is held for elimination. Under normal circumstances, gentle, rhythmic contractions on the bowel walls push the fecal material along the colon towards the rectum. In IBS, however, these contractions are irregular and poorly coordinated. The fecal material is either retained too long, resulting in constipation, or expelled too soon, producing diarrhea. SYMPTOMS  The most common symptom of IBS is pain. It is typically in the lower left side of the belly (abdomen). But it may occur anywhere in the abdomen. It can be felt as heartburn, backache, or even as a dull pain in the arms or shoulders. The pain comes from excessive bowel-muscle spasms and from the buildup of gas and fecal material in the  colon. This pain:  Can range from sharp belly (abdominal) cramps to a dull, continuous ache.  Usually worsens soon after eating.  Is typically relieved by having a bowel movement or passing gas. Abdominal pain is usually accompanied by constipation. But it may also produce diarrhea. The diarrhea typically occurs right after a meal or upon arising in the morning. The stools are typically soft and watery. They are often flecked with secretions (mucus). Other symptoms of IBS include:  Bloating.  Loss of appetite.  Heartburn.  Feeling sick to your stomach (nausea).  Belching  Vomiting  Gas. IBS may also cause a number of symptoms that are unrelated to the digestive system:  Fatigue.  Headaches.  Anxiety  Shortness of breath  Difficulty in concentrating.  Dizziness. These symptoms tend to come and go. DIAGNOSIS  The symptoms of IBS closely mimic the symptoms of other, more serious digestive disorders. So your caregiver may wish to perform a variety of additional tests to exclude these disorders. He/she wants to be certain of learning what is wrong (diagnosis). The nature and purpose of each test will be explained to you. TREATMENT A number of medications are available to help correct bowel function and/or relieve bowel spasms and abdominal pain. Among the drugs available are:  Mild, non-irritating laxatives for severe constipation and to help restore normal bowel habits.  Specific anti-diarrheal medications to treat severe or prolonged diarrhea.  Anti-spasmodic agents to relieve intestinal cramps.  Your caregiver may also decide to treat you with a mild tranquilizer or sedative during unusually stressful periods in your life. The important thing to remember is that if any drug is prescribed for you, make sure that you take it exactly as directed. Make sure that your caregiver knows  how well it worked for you. HOME CARE INSTRUCTIONS   Avoid foods that are high in fat or  oils. Some examples VHQ:IONGE cream, butter, frankfurters, sausage, and other fatty meats.  Avoid foods that have a laxative effect, such as fruit, fruit juice, and dairy products.  Cut out carbonated drinks, chewing gum, and "gassy" foods, such as beans and cabbage. This may help relieve bloating and belching.  Bran taken with plenty of liquids may help relieve constipation.  Keep track of what foods seem to trigger your symptoms.  Avoid emotionally charged situations or circumstances that produce anxiety.  Start or continue exercising.  Get plenty of rest and sleep. MAKE SURE YOU:   Understand these instructions.  Will watch your condition.  Will get help right away if you are not doing well or get worse. Document Released: 11/08/2005 Document Revised: 01/31/2012 Document Reviewed: 06/28/2008 Exit[employer] Patient Information 2014 [employer], [place]. _____________________________________________________________________________________________________________________________________________________________________________________________________  Diet for Gastroesophageal Reflux Disease, Adult Reflux (acid reflux) is when acid from your stomach flows up into the esophagus. When acid comes in contact with the esophagus, the acid causes irritation and soreness (inflammation) in the esophagus. When reflux happens often or so severely that it causes damage to the esophagus, it is called gastroesophageal reflux disease (GERD). Nutrition therapy can help ease the discomfort of GERD. FOODS OR DRINKS TO AVOID OR LIMIT  Smoking or chewing tobacco. Nicotine is one of the most potent stimulants to acid production in the gastrointestinal tract.  Caffeinated and decaffeinated coffee and black tea.  Regular or low-calorie carbonated beverages or energy drinks (caffeine-free carbonated beverages are allowed).   Strong spices, such as black pepper, white pepper, red pepper, cayenne, curry powder, and  chili powder.  Peppermint or spearmint.  Chocolate.  High-fat foods, including meats and fried foods. Extra added fats including oils, butter, salad dressings, and nuts. Limit these to less than 8 tsp per day.  Fruits and vegetables if they are not tolerated, such as citrus fruits or tomatoes.  Alcohol.  Any food that seems to aggravate your condition. If you have questions regarding your diet, call your caregiver or a registered dietitian. OTHER THINGS THAT MAY HELP GERD INCLUDE:   Eating your meals slowly, in a relaxed setting.  Eating 5 to 6 small meals per day instead of 3 large meals.  Eliminating food for a period of time if it causes distress.  Not lying down until 3 hours after eating a meal.  Keeping the head of your bed raised 6 to 9 inches (15 to 23 cm) by using a foam wedge or blocks under the legs of the bed. Lying flat may make symptoms worse.  Being physically active. Weight loss may be helpful in reducing reflux in overweight or obese adults.  Wear loose fitting clothing EXAMPLE MEAL PLAN This meal plan is approximately 2,000 calories based on https://www.bernard.org/ meal planning guidelines. Breakfast   cup cooked oatmeal.  1 cup strawberries.  1 cup low-fat milk.  1 oz almonds. Snack  1 cup cucumber slices.  6 oz yogurt (made from low-fat or fat-free milk). Lunch  2 slice whole-wheat bread.  2 oz sliced [place].  2 tsp mayonnaise.  1 cup blueberries.  1 cup snap peas. Snack  6 whole-wheat crackers.  1 oz string cheese. Dinner   cup brown rice.  1 cup mixed veggies.  1 tsp olive oil.  3 oz grilled fish. Document Released: 11/08/2005 Document Revised: 01/31/2012 Document Reviewed: 09/24/2011 [facility] Patient Information 2014 [place], [place].

## 2013-09-07 NOTE — Progress Notes (Signed)
History of Present Illness: This is a [age]-year-old female with IBS and normal recent endoscopy and colonoscopy.  She did have a hiatal hernia but no esophagitis.  Biopsies were all negative.  She currently is on IBS regime and is doing extremely well with when necessary sublingual Levsin.  She readily admits that she has some mild solution of nonabsorbable carbohydrates such as beans.  Colonoscopy also was unremarkable with random colon biopsy showed no evidence of microscopic or collagenous colitis.    Current Medications, Allergies, Past Medical History, Past Surgical History, Family History and Social History were reviewed in [employer] record.  ROS: All systems were reviewed and are negative unless otherwise stated in the HPI.         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  I placed her on a FOD_MAP diet for IBS, and have explained acid reflux with when necessary Pepcid a.c. use for her acid reflux symptoms.  She also saw her patient education on IBS and its management.  We'll see her on when necessary basis the future as needed

## 2013-09-27 ENCOUNTER — Other Ambulatory Visit: Payer: Self-pay

## 2013-12-18 ENCOUNTER — Other Ambulatory Visit: Payer: Self-pay | Admitting: *Deleted

## 2013-12-18 DIAGNOSIS — E039 Hypothyroidism, unspecified: Secondary | ICD-10-CM

## 2013-12-18 MED ORDER — LEVOTHYROXINE SODIUM 88 MCG PO TABS: 88.0000 ug | ORAL_TABLET | Freq: Every day | ORAL | Status: DC

## 2013-12-18 NOTE — Telephone Encounter (Signed)
Received a fax from [facility] nurse patient asking for more than 90 day supply from home delivery pharmacy- prescription attached. Prescription filled out and faxed back to [facility] delivery for Levothyroxin 60mcg 90 day supply with 2 refills. Called patient and left a message we got the request for her prescription and we have approved it. Call if any questions.

## 2014-04-21 NOTE — Progress Notes (Signed)
Subjective:    Patient ID: Laura Mann, female    DOB: 1966/04/05, 48 y.o.   MRN: [id number]  HPI  Laura Mann is here for acute visit.    She reports 2 years ago she thinks she broke the 5th digit of her left foot when something fell on toe.  She did not get xray.    She now has burning sensation along lateral side of foot with redness that comes and goes.  She has not felt the need to take any Advil for this .    She also has new situational stress.  Father diagnosed with Lewey body and Alzheimers dementia and is now in a nursing home.  She will be travelling to help with this.  She has intermittant insomnia when she is stressed and Valium has worked int he past for her    See labs.    Mild anemia that is chronic for pt.  She is on gluten free diet as her daughter has Celiac  Allergies  Allergen Reactions  . Codeine Other (See Comments)    dizziness   Past Medical History  Diagnosis Date  . Headache(784.0)     migraine  . Rosacea   . Depression   . Hypothyroidism   . Hashimoto thyroiditis   . Anxiety   . Arthritis   . Schatzki's ring 2014   Past Surgical History  Procedure Laterality Date  . No past surgeries     History   Social History  . Marital Status: Married    Spouse Name: Laura Mann    Number of Children: 2  . Years of Education: Master's   Occupational History  . Occupational therapist    Social History Main Topics  . Smoking status: Never Smoker   . Smokeless tobacco: Never Used  . Alcohol Use: No  . Drug Use: No  . Sexual Activity: Yes    Partners: Male   Other Topics Concern  . Not on file   Social History Narrative  . No narrative on file   Family History  Problem Relation Age of Onset  . Depression Father   . Melanoma Father   . Heart disease Father   . Diabetes Father   . Melanoma Mother   . Stroke Maternal Grandfather   . Uterine cancer Maternal Grandmother   . Other Daughter     brain tumor  . Celiac disease Daughter   .  Diabetes Mellitus I Daughter     insulin dependant  . Colon polyps Maternal Grandmother   . Irritable bowel syndrome Mother    Patient Active Problem List   Diagnosis Date Noted  . Migraine 04/04/2013  . Allergic rhinitis 07/18/2012  . Anxiety   . Headache   . Depression   . Rosacea   . Hypothyroidism    Current Outpatient Prescriptions on File Prior to Visit  Medication Sig Dispense Refill  . fluticasone (FLONASE) 50 MCG/ACT nasal spray Place 2 sprays into the nose daily as needed.      . hyoscyamine (LEVSIN SL) 0.125 MG SL tablet Place 1 tablet (0.125 mg total) under the tongue every 4 (four) hours as needed for cramping.  30 tablet  1  . ibuprofen (ADVIL,MOTRIN) 800 MG tablet 800 mg every 6 (six) hours as needed. Take one tablet twice a day prior to menses      . levothyroxine (SYNTHROID, LEVOTHROID) 88 MCG tablet Take 1 tablet (88 mcg total) by mouth daily before breakfast.  90 tablet  2  . metroNIDAZOLE (METROGEL) 1 % gel Apply 1 application topically daily. To face       . SUMAtriptan (IMITREX) 100 MG tablet TAKE ONE TABLET AT ONSET OF HEADACHE. MAY REPEAT ONE TIME ONLY IN 2 HOURS prn       No current facility-administered medications on file prior to visit.      Review of Systems See HPI    Objective:   Physical Exam  Physical Exam  Nursing note and vitals reviewed.  Constitutional: She is oriented to person, place, and time. She appears well-developed and well-nourished.  HENT:  Head: Normocephalic and atraumatic.  Cardiovascular: Normal rate and regular rhythm. Exam reveals no gallop and no friction rub.  No murmur heard.  Pulmonary/Chest: Breath sounds normal. She has no wheezes. She has no rales.  Neurological: She is alert and oriented to person, place, and time.  Skin: Skin is warm and dry.  Ext:  5th digit  No erythema  No edema  No pain to palpation Psychiatric: She has a normal mood and affect. Her behavior is normal.             Assessment &  Plan:  Foot and toe pain:  Will get xray today .  Burning sensation may be neuropathic in nature.    Refer to podiatry  Anemia  Will get nutritional panel.   Rx integra daily  Situational stress and insomnia  Valium 5 mg  Ok  Rx given  See me as needed

## 2014-04-22 ENCOUNTER — Ambulatory Visit (HOSPITAL_BASED_OUTPATIENT_CLINIC_OR_DEPARTMENT_OTHER)
Admission: RE | Admit: 2014-04-22 | Discharge: 2014-04-22 | Disposition: A | Discharge status: Home / Self Care | Payer: 59 | Source: Ambulatory Visit | Attending: Internal Medicine | Admitting: Internal Medicine

## 2014-04-22 ENCOUNTER — Ambulatory Visit (INDEPENDENT_AMBULATORY_CARE_PROVIDER_SITE_OTHER): Payer: 59 | Admitting: Internal Medicine

## 2014-04-22 ENCOUNTER — Other Ambulatory Visit: Payer: Self-pay | Admitting: Internal Medicine

## 2014-04-22 ENCOUNTER — Encounter: Payer: Self-pay | Admitting: Internal Medicine

## 2014-04-22 VITALS — BP 111/66 | HR 69 | Temp 98.3°F | Resp 18 | Ht 64.0 in | Wt 133.0 lb

## 2014-04-22 DIAGNOSIS — G4700 Insomnia, unspecified: Secondary | ICD-10-CM

## 2014-04-22 DIAGNOSIS — D649 Anemia, unspecified: Secondary | ICD-10-CM

## 2014-04-22 DIAGNOSIS — M79609 Pain in unspecified limb: Secondary | ICD-10-CM

## 2014-04-22 DIAGNOSIS — M79673 Pain in unspecified foot: Secondary | ICD-10-CM

## 2014-04-22 DIAGNOSIS — F439 Reaction to severe stress, unspecified: Secondary | ICD-10-CM

## 2014-04-22 DIAGNOSIS — Z733 Stress, not elsewhere classified: Secondary | ICD-10-CM

## 2014-04-22 LAB — ANEMIA PANEL 7
%SAT: 14 % — ABNORMAL LOW (ref 20–55)
ABS Retic: 53.8 10*3/uL (ref 19.0–186.0)
Ferritin: 12 ng/mL (ref 10–291)
Folate: >20 ng/mL
HCT: 35.2 % — ABNORMAL LOW (ref 36.0–46.0)
Hemoglobin: 11.8 g/dL — ABNORMAL LOW (ref 12.0–15.0)
Iron: 57 ug/dL (ref 42–145)
MCH: 28.5 pg (ref 26.0–34.0)
MCHC: 33.5 g/dL (ref 30.0–36.0)
MCV: 85 fL (ref 78.0–100.0)
Platelets: 231 10*3/uL (ref 150–400)
RBC.: 4.14 MIL/uL (ref 3.87–5.11)
RBC: 4.14 MIL/uL (ref 3.87–5.11)
RDW: 14.1 % (ref 11.5–15.5)
Retic Ct Pct: 1.3 % (ref 0.4–2.3)
TIBC: 407 ug/dL (ref 250–470)
UIBC: 350 ug/dL (ref 125–400)
Vitamin B-12: 448 pg/mL (ref 211–911)
WBC: 4 10*3/uL (ref 4.0–10.5)

## 2014-04-22 MED ORDER — DIAZEPAM 5 MG PO TABS: ORAL_TABLET | ORAL | Status: DC

## 2014-04-22 MED ORDER — INTEGRA 62.5-62.5-40-3 MG PO CAPS: ORAL_CAPSULE | ORAL | Status: DC

## 2014-04-22 NOTE — Patient Instructions (Signed)
To lab today    Will refer to Dr. Caffie Pinto  Podiatry  Pain 5th toe  To xray today

## 2014-04-23 ENCOUNTER — Telehealth: Payer: Self-pay | Admitting: *Deleted

## 2014-04-23 ENCOUNTER — Telehealth: Payer: Self-pay | Admitting: Internal Medicine

## 2014-04-23 NOTE — Telephone Encounter (Signed)
Notified pt of -xray results via VMM

## 2014-04-23 NOTE — Telephone Encounter (Signed)
Message copied by Conley Rolls on Tue Apr 23, 2014  2:57 PM ------      Message from: Emi Belfast D      Created: Mon Apr 22, 2014  3:00 PM       [employer] and let her know her bones and soft tissues in her foot are normal.  She should have appt with podiatris ------

## 2014-04-23 NOTE — Telephone Encounter (Signed)
Spoke with pt and informed of iron deficiency anemia  She had colonoscopy done 9/14  With upper endoscopy  Will take integra one daily

## 2014-04-24 ENCOUNTER — Encounter: Payer: Self-pay | Admitting: *Deleted

## 2014-04-24 ENCOUNTER — Ambulatory Visit (INDEPENDENT_AMBULATORY_CARE_PROVIDER_SITE_OTHER): Payer: 59 | Admitting: Podiatry

## 2014-04-24 ENCOUNTER — Encounter: Payer: Self-pay | Admitting: Podiatry

## 2014-04-24 VITALS — BP 111/59 | HR 66 | Ht 64.0 in | Wt 133.0 lb

## 2014-04-24 DIAGNOSIS — M21969 Unspecified acquired deformity of unspecified lower leg: Secondary | ICD-10-CM

## 2014-04-24 DIAGNOSIS — M79606 Pain in leg, unspecified: Secondary | ICD-10-CM | POA: Insufficient documentation

## 2014-04-24 DIAGNOSIS — M79609 Pain in unspecified limb: Secondary | ICD-10-CM

## 2014-04-24 DIAGNOSIS — M2040 Other hammer toe(s) (acquired), unspecified foot: Secondary | ICD-10-CM

## 2014-04-24 NOTE — Patient Instructions (Signed)
Seen for left 5th digit problem.  Noted of weakened first Metatarsal bone left. May benefit from Metatarsal binder and Custom orthotics.

## 2014-04-24 NOTE — Progress Notes (Signed)
Subjective: 48 year old female presents complaining of pain and burning sensation off and on 5th toe left for several weeks. Now burns at base of the 5th left.  Wears open toe slippers. Not required to be on feet long period. Been caring for her 14 year old daughter who has had stroke, brain cancer, and diabetic condition.   Review of Systems - General ROS: negative for - chills, fatigue, hot flashes, night sweats, sleep disturbance, weight gain or weight loss Ophthalmic ROS: negative ENT ROS: negative Breast ROS: negative for breast lumps Respiratory ROS: no cough, shortness of breath, or wheezing Cardiovascular ROS: no chest pain or dyspnea on exertion Gastrointestinal ROS: Irritable bowel syndrome.  Genito-Urinary ROS: no dysuria, trouble voiding, or hematuria Musculoskeletal ROS: Low back and hip pain occasional. Neurological ROS: Frequent migrane head ache. Dermatological ROS: Under control on [place].   Objective: High arched Cavus type foot, unstable first ray, and contracted lesser digits left foot.  Enlarged 5th digit at inter phalangeal joint left foot without acute edema or redness.  Neurovascular status are within normal. No skin lesions or abnormal dermatologic findings.   Assessment: Hypermobile first ray left. Lateral weight shifting left. Contracted lesser digits with pain on 5th left.   Plan: Reviewed findings and available treatment options. Metatarsal binder dispensed. May benefit from Custom Orthotics to support medial column left foot.

## 2014-04-29 ENCOUNTER — Ambulatory Visit: Payer: 59 | Admitting: Podiatry

## 2014-05-08 ENCOUNTER — Ambulatory Visit: Payer: 59 | Admitting: Podiatry

## 2014-05-22 ENCOUNTER — Encounter: Payer: Self-pay | Admitting: Podiatry

## 2014-05-22 ENCOUNTER — Ambulatory Visit (INDEPENDENT_AMBULATORY_CARE_PROVIDER_SITE_OTHER): Payer: 59 | Admitting: Podiatry

## 2014-05-22 VITALS — BP 108/54 | HR 61

## 2014-05-22 DIAGNOSIS — M7750 Other enthesopathy of unspecified foot: Secondary | ICD-10-CM

## 2014-05-22 DIAGNOSIS — M7742 Metatarsalgia, left foot: Principal | ICD-10-CM

## 2014-05-22 DIAGNOSIS — M7741 Metatarsalgia, right foot: Secondary | ICD-10-CM

## 2014-05-22 DIAGNOSIS — M7730 Calcaneal spur, unspecified foot: Secondary | ICD-10-CM

## 2014-05-22 DIAGNOSIS — M21969 Unspecified acquired deformity of unspecified lower leg: Secondary | ICD-10-CM

## 2014-05-22 NOTE — Progress Notes (Signed)
Patient came in to have orthotics prepared.  Pain at the 4th intermetatarsal space like a nerve pain.  X-rays taken.  Noted of metatarus adductus bilateral, cavus type foot with elevated first ray, presence of plantar calcaneal spur bilateral.  Assessment: Plantar fasciitis right. Lesser metatarsalgia left secondary to lateral weight shifting from unstable first ray and compensatory pronation.  Plan: Casted for Orthotics.  Continue to use Metatarsal binder as needed. Will contact when orthotics are ready.

## 2014-05-22 NOTE — Patient Instructions (Signed)
Casted for Orthotics.  Continue to use Metatarsal binder as needed. Will contact when orthotics are ready.

## 2014-07-05 ENCOUNTER — Ambulatory Visit: Payer: 59 | Admitting: Podiatry

## 2014-07-10 ENCOUNTER — Ambulatory Visit: Payer: 59 | Admitting: Podiatry

## 2014-07-17 ENCOUNTER — Encounter: Payer: Self-pay | Admitting: Podiatry

## 2014-07-17 ENCOUNTER — Ambulatory Visit (INDEPENDENT_AMBULATORY_CARE_PROVIDER_SITE_OTHER): Payer: 59 | Admitting: Podiatry

## 2014-07-17 DIAGNOSIS — M7730 Calcaneal spur, unspecified foot: Secondary | ICD-10-CM

## 2014-07-17 DIAGNOSIS — M7731 Calcaneal spur, right foot: Secondary | ICD-10-CM

## 2014-07-17 NOTE — Progress Notes (Signed)
One month orthotic check. Doing well with Orthotics and Metatarsal binder on right. Minimum discomfort on right heel. Continue current level of care. Return as needed.

## 2014-07-17 NOTE — Patient Instructions (Signed)
One month orthotic check. Doing well with Orthotics. Return as needed.

## 2014-08-21 ENCOUNTER — Ambulatory Visit: Payer: 59 | Admitting: Internal Medicine

## 2014-08-21 ENCOUNTER — Encounter: Payer: Self-pay | Admitting: Internal Medicine

## 2014-08-21 ENCOUNTER — Ambulatory Visit (INDEPENDENT_AMBULATORY_CARE_PROVIDER_SITE_OTHER): Payer: 59 | Admitting: Internal Medicine

## 2014-08-21 VITALS — BP 100/65 | HR 69 | Temp 98.1°F | Resp 16 | Ht 64.0 in | Wt 130.0 lb

## 2014-08-21 DIAGNOSIS — D509 Iron deficiency anemia, unspecified: Secondary | ICD-10-CM

## 2014-08-21 DIAGNOSIS — Z23 Encounter for immunization: Secondary | ICD-10-CM

## 2014-08-21 DIAGNOSIS — D649 Anemia, unspecified: Secondary | ICD-10-CM

## 2014-08-21 DIAGNOSIS — E038 Other specified hypothyroidism: Secondary | ICD-10-CM

## 2014-08-21 DIAGNOSIS — M2550 Pain in unspecified joint: Secondary | ICD-10-CM

## 2014-08-21 LAB — CBC WITH DIFFERENTIAL/PLATELET
Basophils Absolute: 0 10*3/uL (ref 0.0–0.1)
Basophils Relative: 0 % (ref 0–1)
Eosinophils Absolute: 0 10*3/uL (ref 0.0–0.7)
Eosinophils Relative: 1 % (ref 0–5)
HCT: 34 % — ABNORMAL LOW (ref 36.0–46.0)
Hemoglobin: 11.5 g/dL — ABNORMAL LOW (ref 12.0–15.0)
Lymphocytes Relative: 40 % (ref 12–46)
Lymphs Abs: 2 10*3/uL (ref 0.7–4.0)
MCH: 28.9 pg (ref 26.0–34.0)
MCHC: 33.8 g/dL (ref 30.0–36.0)
MCV: 85.4 fL (ref 78.0–100.0)
Monocytes Absolute: 0.4 10*3/uL (ref 0.1–1.0)
Monocytes Relative: 8 % (ref 3–12)
Neutro Abs: 2.5 10*3/uL (ref 1.7–7.7)
Neutrophils Relative %: 51 % (ref 43–77)
Platelets: 219 10*3/uL (ref 150–400)
RBC: 3.98 MIL/uL (ref 3.87–5.11)
RDW: 14 % (ref 11.5–15.5)
WBC: 4.9 10*3/uL (ref 4.0–10.5)

## 2014-08-21 LAB — COMPLETE METABOLIC PANEL WITH GFR
ALT: 20 U/L (ref 0–35)
AST: 21 U/L (ref 0–37)
Albumin: 4.1 g/dL (ref 3.5–5.2)
Alkaline Phosphatase: 54 U/L (ref 39–117)
BUN: 14 mg/dL (ref 6–23)
CO2: 24 mEq/L (ref 19–32)
Calcium: 8.9 mg/dL (ref 8.4–10.5)
Chloride: 103 mEq/L (ref 96–112)
Creat: 0.6 mg/dL (ref 0.50–1.10)
GFR, Est African American: >89 mL/min
GFR, Est Non African American: >89 mL/min
Glucose, Bld: 83 mg/dL (ref 70–99)
Potassium: 4.2 mEq/L (ref 3.5–5.3)
Sodium: 138 mEq/L (ref 135–145)
Total Bilirubin: 1.3 mg/dL — ABNORMAL HIGH (ref 0.2–1.2)
Total Protein: 6.2 g/dL (ref 6.0–8.3)

## 2014-08-21 LAB — LIPID PANEL
Cholesterol: 124 mg/dL (ref 0–200)
HDL: 56 mg/dL (ref >39)
LDL Cholesterol: 55 mg/dL (ref 0–99)
Total CHOL/HDL Ratio: 2.2 Ratio
Triglycerides: 63 mg/dL (ref <150)
VLDL: 13 mg/dL (ref 0–40)

## 2014-08-21 LAB — RHEUMATOID FACTOR: Rhuematoid fact SerPl-aCnc: <10 IU/mL (ref <=14)

## 2014-08-21 LAB — TSH: TSH: 0.54 u[IU]/mL (ref 0.350–4.500)

## 2014-08-21 NOTE — Patient Instructions (Signed)
To lab today   See me as needed 

## 2014-08-21 NOTE — Progress Notes (Signed)
Subjective:    Patient ID: Laura Mann, female    DOB: 16-Jun-1966, 48 y.o.   MRN: [id number]  HPI  Prior OV  Foot and toe pain: Will get xray today . Burning sensation may be neuropathic in nature. Refer to podiatry  Anemia Will get nutritional panel. Rx integra daily  Situational stress and insomnia Valium 5 mg Ok Rx given  See me as needed   Today's visit:  Laura Mann has been taking her Integra daily and tolerating fine  .  She reports she does have heavy menses at times.    She does have arthralgias of hands and both hips  And reports her father had RA.  She denies redness or edema to peripheral joints but would like to be checked for RA  Anxiety  Rare use of Valium now.  Daughter and mother adjusting to Diabetes and Celiac disease at school     Allergies  Allergen Reactions  . Codeine Other (See Comments)    dizziness   Past Medical History  Diagnosis Date  . Headache(784.0)     migraine  . Rosacea   . Depression   . Hypothyroidism   . Hashimoto thyroiditis   . Anxiety   . Arthritis   . Schatzki's ring 2014   Past Surgical History  Procedure Laterality Date  . No past surgeries     History   Social History  . Marital Status: Married    Spouse Name: Ronald Pippins    Number of Children: 2  . Years of Education: Master's   Occupational History  . Occupational therapist    Social History Main Topics  . Smoking status: Never Smoker   . Smokeless tobacco: Never Used  . Alcohol Use: No  . Drug Use: No  . Sexual Activity: Yes    Partners: Male   Other Topics Concern  . Not on file   Social History Narrative  . No narrative on file   Family History  Problem Relation Age of Onset  . Depression Father   . Melanoma Father   . Heart disease Father   . Diabetes Father   . Melanoma Mother   . Stroke Maternal Grandfather   . Uterine cancer Maternal Grandmother   . Other Daughter     brain tumor  . Celiac disease Daughter   . Diabetes Mellitus I  Daughter     insulin dependant  . Colon polyps Maternal Grandmother   . Irritable bowel syndrome Mother    Patient Active Problem List   Diagnosis Date Noted  . Metatarsalgia of both feet 05/22/2014  . Heel spur 05/22/2014  . Metatarsal deformity 04/24/2014  . Other hammer toe (acquired) 04/24/2014  . Pain in lower limb 04/24/2014  . Anemia 04/22/2014  . Insomnia 04/22/2014  . Situational stress 04/22/2014  . Migraine 04/04/2013  . Allergic rhinitis 07/18/2012  . Anxiety   . Headache   . Depression   . Rosacea   . Hypothyroidism    Current Outpatient Prescriptions on File Prior to Visit  Medication Sig Dispense Refill  . diazepam (VALIUM) 5 MG tablet Take one at HS prn  20 tablet  1  . Fe Fum-FePoly-Vit C-Vit B3 (INTEGRA) 62.5-62.5-40-3 MG CAPS Take one daily  Give generic  30 capsule  1  . fluticasone (FLONASE) 50 MCG/ACT nasal spray Place 2 sprays into the nose daily as needed.      . hyoscyamine (LEVSIN SL) 0.125 MG SL tablet Place 1 tablet (0.125 mg total)  under the tongue every 4 (four) hours as needed for cramping.  30 tablet  1  . ibuprofen (ADVIL,MOTRIN) 800 MG tablet 800 mg every 6 (six) hours as needed. Take one tablet twice a day prior to menses      . levothyroxine (SYNTHROID, LEVOTHROID) 88 MCG tablet Take 1 tablet (88 mcg total) by mouth daily before breakfast.  90 tablet  2  . metroNIDAZOLE (METROGEL) 1 % gel Apply 1 application topically daily. To face       . SUMAtriptan (IMITREX) 100 MG tablet TAKE ONE TABLET AT ONSET OF HEADACHE. MAY REPEAT ONE TIME ONLY IN 2 HOURS prn       No current facility-administered medications on file prior to visit.     Review of Systems    see HPI Objective:   Physical Exam Physical Exam  Nursing note and vitals reviewed.  Constitutional: She is oriented to person, place, and time. She appears well-developed and well-nourished.  HENT:  Head: Normocephalic and atraumatic.  Cardiovascular: Normal rate and regular rhythm.  Exam reveals no gallop and no friction rub.  No murmur heard.  Pulmonary/Chest: Breath sounds normal. She has no wheezes. She has no rales.  Neurological: She is alert and oriented to person, place, and time.  Skin: Skin is warm and dry.  M/S  No active synovitis to any joint Psychiatric: She has a normal mood and affect. Her behavior is normal.         Assessment & Plan:  FE deficiency anemia :   Will recheck today  She did have complete upper and lower endoscopy in 2014  Arthralgias:  Will check ANA and RF today   Anxiety  Rare Valium

## 2014-08-22 LAB — VITAMIN D 25 HYDROXY (VIT D DEFICIENCY, FRACTURES): Vit D, 25-Hydroxy: 34 ng/mL (ref 30–89)

## 2014-08-22 LAB — ANA: Anti Nuclear Antibody(ANA): NEGATIVE

## 2014-08-26 ENCOUNTER — Telehealth: Payer: Self-pay | Admitting: Internal Medicine

## 2014-08-26 NOTE — Telephone Encounter (Signed)
Spoke with pt and informed of lab results.  No change in mild anemia with FE   Advised pt to discuss with Dr. Matthew Saras her Gyn who seh has an appt with next 2-3 weeks

## 2014-08-26 NOTE — Telephone Encounter (Signed)
See prior note

## 2014-09-12 ENCOUNTER — Ambulatory Visit (INDEPENDENT_AMBULATORY_CARE_PROVIDER_SITE_OTHER): Payer: 59 | Admitting: Family Medicine

## 2014-09-12 ENCOUNTER — Encounter (INDEPENDENT_AMBULATORY_CARE_PROVIDER_SITE_OTHER): Payer: Self-pay

## 2014-09-12 ENCOUNTER — Encounter: Payer: Self-pay | Admitting: Family Medicine

## 2014-09-12 VITALS — BP 124/74 | HR 90 | Ht 64.0 in | Wt 130.0 lb

## 2014-09-12 DIAGNOSIS — M7711 Lateral epicondylitis, right elbow: Secondary | ICD-10-CM

## 2014-09-12 NOTE — Patient Instructions (Signed)
You have lateral epicondylitis Try to avoid painful activities as much as possible. Ice the area 3-4 times a day for 15 minutes at a time as needed. Tylenol or aleve as needed for pain. Counterforce brace as directed can help unload area - wear this regularly if it provides you with relief. Home Pronation/supination with hammer, wrist extension with 1 pound weight, stretching - do these once a day. 3 sets of 10 of exercises, 20-30 seconds stretching. Consider formal physical therapy. Consider nitro patches, physical therapy, injection for short term pain relief if the above is not helping. Follow up with me in 5-6 weeks.

## 2014-09-17 ENCOUNTER — Encounter: Payer: Self-pay | Admitting: Family Medicine

## 2014-09-17 DIAGNOSIS — M7711 Lateral epicondylitis, right elbow: Secondary | ICD-10-CM | POA: Insufficient documentation

## 2014-09-17 NOTE — Assessment & Plan Note (Signed)
reviewed home exercises to do daily.  Icing, tylenol/aleve if needed.  Counterforce brace or sleeve for support.  Consider nitro patches, physical therapy, injection if not improving as expected.  Follow up with me in 5-6 weeks.

## 2014-09-17 NOTE — Progress Notes (Signed)
Patient ID: Laura Mann, female   DOB: October 20, 1966, 48 y.o.   MRN: [id number]  PCP and referred by: Kelton Pillar, MD  Subjective:   HPI: Patient is a 48 y.o. female here for right elbow pain.  Patient denies known injury. For past two months has had pain lateral right elbow. Worse with a lot of opening, gripping things. Can radiate down to wrist. Tender to touch.   Right handed. No swelling or bruising.  Past Medical History  Diagnosis Date  . Headache(784.0)     migraine  . Rosacea   . Depression   . Hypothyroidism   . Hashimoto thyroiditis   . Anxiety   . Arthritis   . Schatzki's ring 2014    Current Outpatient Prescriptions on File Prior to Visit  Medication Sig Dispense Refill  . diazepam (VALIUM) 5 MG tablet Take one at HS prn  20 tablet  1  . Fe Fum-FePoly-Vit C-Vit B3 (INTEGRA) 62.5-62.5-40-3 MG CAPS Take one daily  Give generic  30 capsule  1  . fluticasone (FLONASE) 50 MCG/ACT nasal spray Place 2 sprays into the nose daily as needed.      . hyoscyamine (LEVSIN SL) 0.125 MG SL tablet Place 1 tablet (0.125 mg total) under the tongue every 4 (four) hours as needed for cramping.  30 tablet  1  . ibuprofen (ADVIL,MOTRIN) 800 MG tablet 800 mg every 6 (six) hours as needed. Take one tablet twice a day prior to menses      . levothyroxine (SYNTHROID, LEVOTHROID) 88 MCG tablet Take 1 tablet (88 mcg total) by mouth daily before breakfast.  90 tablet  2  . metroNIDAZOLE (METROGEL) 1 % gel Apply 1 application topically daily. To face       . SUMAtriptan (IMITREX) 100 MG tablet TAKE ONE TABLET AT ONSET OF HEADACHE. MAY REPEAT ONE TIME ONLY IN 2 HOURS prn       No current facility-administered medications on file prior to visit.    Past Surgical History  Procedure Laterality Date  . No past surgeries      Allergies  Allergen Reactions  . Codeine Other (See Comments)    dizziness    History   Social History  . Marital Status: Married    Spouse Name:  Ronald Pippins    Number of Children: 2  . Years of Education: Master's   Occupational History  . Occupational therapist    Social History Main Topics  . Smoking status: Never Smoker   . Smokeless tobacco: Never Used  . Alcohol Use: No  . Drug Use: No  . Sexual Activity: Yes    Partners: Male   Other Topics Concern  . Not on file   Social History Narrative  . No narrative on file    Family History  Problem Relation Age of Onset  . Depression Father   . Melanoma Father   . Heart disease Father   . Diabetes Father   . Melanoma Mother   . Stroke Maternal Grandfather   . Uterine cancer Maternal Grandmother   . Other Daughter     brain tumor  . Celiac disease Daughter   . Diabetes Mellitus I Daughter     insulin dependant  . Colon polyps Maternal Grandmother   . Irritable bowel syndrome Mother     BP 124/74  Pulse 90  Ht 5\' 4"  (1.626 m)  Wt 130 lb (58.968 kg)  BMI 22.30 kg/m2  LMP 08/08/2014  Review of Systems: See HPI above.  Objective:  Physical Exam:  Gen: NAD  Right elbow: No gross deformity, swelling, bruising. TTP lateral epicondyle and just distal to this reproducing pain. FROM elbow and wrist.  Pain with resisted wrist and 3rd digit extension. NVI distally. Collateral ligaments intact.    Assessment & Plan:  1. Right elbow lateral epicondylitis - reviewed home exercises to do daily.  Icing, tylenol/aleve if needed.  Counterforce brace or sleeve for support.  Consider nitro patches, physical therapy, injection if not improving as expected.  Follow up with me in 5-6 weeks.

## 2014-09-23 ENCOUNTER — Encounter: Payer: Self-pay | Admitting: Family Medicine

## 2014-09-23 ENCOUNTER — Other Ambulatory Visit: Payer: Self-pay | Admitting: *Deleted

## 2014-09-23 DIAGNOSIS — E039 Hypothyroidism, unspecified: Secondary | ICD-10-CM

## 2014-09-23 MED ORDER — SUMATRIPTAN SUCCINATE 100 MG PO TABS: ORAL_TABLET | ORAL | Status: DC

## 2014-09-23 MED ORDER — LEVOTHYROXINE SODIUM 88 MCG PO TABS: 88.0000 ug | ORAL_TABLET | Freq: Every day | ORAL | Status: DC

## 2014-09-23 NOTE — Telephone Encounter (Signed)
Refill request. Pt asking for 90 day supply to be sent to [facility].

## 2014-10-02 NOTE — Addendum Note (Signed)
Addended by: Sherrie George F on: 10/02/2014 09:51 AM   Modules accepted: Orders

## 2014-10-03 ENCOUNTER — Telehealth: Payer: Self-pay

## 2014-10-03 NOTE — Telephone Encounter (Signed)
Laura Mann [phone]  Audryna is wondering if Dr Coralyn Mark can write a prescription for PT to Evaluate and Treat, she has found a Physical therapists that specializes with migraines.

## 2014-10-03 NOTE — Telephone Encounter (Signed)
Up at desk

## 2014-10-07 ENCOUNTER — Other Ambulatory Visit: Payer: Self-pay | Admitting: Obstetrics and Gynecology

## 2014-10-08 LAB — CYTOLOGY - PAP

## 2014-10-24 ENCOUNTER — Ambulatory Visit (INDEPENDENT_AMBULATORY_CARE_PROVIDER_SITE_OTHER): Payer: 59 | Admitting: Family Medicine

## 2014-10-24 ENCOUNTER — Encounter: Payer: Self-pay | Admitting: Family Medicine

## 2014-10-24 VITALS — BP 104/66 | HR 76 | Ht 64.0 in | Wt 133.0 lb

## 2014-10-24 DIAGNOSIS — M7711 Lateral epicondylitis, right elbow: Secondary | ICD-10-CM

## 2014-10-24 NOTE — Patient Instructions (Signed)
You have lateral epicondylitis Try to avoid painful activities as much as possible. Ice the area 3-4 times a day for 15 minutes at a time as needed. Tylenol or aleve as needed for pain. Counterforce brace or sleeve as directed can help unload area - wear this regularly if it provides you with relief. Continue physical therapy and home exercises as you have been. Consider nitro patches, injection if the above is not helping. Follow up with me in 5-6 weeks.

## 2014-10-28 NOTE — Progress Notes (Signed)
Patient ID: Laura Mann, female   DOB: 06/16/1966, 48 y.o.   MRN: [id number]  PCP and referred by: Kelton Pillar, MD  Subjective:   HPI: Patient is a 48 y.o. female here for right elbow pain.  10/22: Patient denies known injury. For past two months has had pain lateral right elbow. Worse with a lot of opening, gripping things. Can radiate down to wrist. Tender to touch.   Right handed. No swelling or bruising.  12/3: Patient returns reporting she's doing home exercise program and bracing. Not improving - pain up to 5/10 when making a fist or flexing wrist. No other changes.  Past Medical History  Diagnosis Date  . Headache(784.0)     migraine  . Rosacea   . Depression   . Hypothyroidism   . Hashimoto thyroiditis   . Anxiety   . Arthritis   . Schatzki's ring 2014    Current Outpatient Prescriptions on File Prior to Visit  Medication Sig Dispense Refill  . diazepam (VALIUM) 5 MG tablet Take one at HS prn 20 tablet 1  . Fe Fum-FePoly-Vit C-Vit B3 (INTEGRA) 62.5-62.5-40-3 MG CAPS Take one daily  Give generic 30 capsule 1  . fluticasone (FLONASE) 50 MCG/ACT nasal spray Place 2 sprays into the nose daily as needed.    . hyoscyamine (LEVSIN SL) 0.125 MG SL tablet Place 1 tablet (0.125 mg total) under the tongue every 4 (four) hours as needed for cramping. 30 tablet 1  . ibuprofen (ADVIL,MOTRIN) 800 MG tablet 800 mg every 6 (six) hours as needed. Take one tablet twice a day prior to menses    . levothyroxine (SYNTHROID, LEVOTHROID) 88 MCG tablet Take 1 tablet (88 mcg total) by mouth daily before breakfast. 90 tablet 2  . metroNIDAZOLE (METROGEL) 1 % gel Apply 1 application topically daily. To face     . SUMAtriptan (IMITREX) 100 MG tablet TAKE ONE TABLET AT ONSET OF HEADACHE. MAY REPEAT ONE TIME ONLY IN 2 HOURS prn 10 tablet 2   No current facility-administered medications on file prior to visit.    Past Surgical History  Procedure Laterality Date  . No past  surgeries      Allergies  Allergen Reactions  . Codeine Other (See Comments)    dizziness    History   Social History  . Marital Status: Married    Spouse Name: Ronald Pippins    Number of Children: 2  . Years of Education: Master's   Occupational History  . Occupational therapist    Social History Main Topics  . Smoking status: Never Smoker   . Smokeless tobacco: Never Used  . Alcohol Use: No  . Drug Use: No  . Sexual Activity:    Partners: Male   Other Topics Concern  . Not on file   Social History Narrative    Family History  Problem Relation Age of Onset  . Depression Father   . Melanoma Father   . Heart disease Father   . Diabetes Father   . Melanoma Mother   . Stroke Maternal Grandfather   . Uterine cancer Maternal Grandmother   . Other Daughter     brain tumor  . Celiac disease Daughter   . Diabetes Mellitus I Daughter     insulin dependant  . Colon polyps Maternal Grandmother   . Irritable bowel syndrome Mother     BP 104/66 mmHg  Pulse 76  Ht 5\' 4"  (1.626 m)  Wt 133 lb (60.328 kg)  BMI 22.82 kg/m2  Review of Systems: See HPI above.    Objective:  Physical Exam:  Gen: NAD  Right elbow: No gross deformity, swelling, bruising. TTP lateral epicondyle and just distal to this reproducing pain. FROM elbow and wrist.  Pain with resisted wrist and 3rd digit extension. NVI distally. Collateral ligaments intact.    Assessment & Plan:  1. Right elbow lateral epicondylitis - discussed options (current treatment PT/HEP, nitro, injection) and she would like to continue with physical therapy and home exercise program for now.  She will consider nitro or injection if still not improving.  Follow up with me in 5-6 weeks.

## 2014-10-28 NOTE — Assessment & Plan Note (Signed)
discussed options (current treatment PT/HEP, nitro, injection) and she would like to continue with physical therapy and home exercise program for now.  She will consider nitro or injection if still not improving.  Follow up with me in 5-6 weeks.

## 2014-11-07 ENCOUNTER — Other Ambulatory Visit: Payer: Self-pay | Admitting: Internal Medicine

## 2014-11-07 NOTE — Telephone Encounter (Signed)
Refill request

## 2014-11-11 NOTE — Telephone Encounter (Signed)
RX called in-eh

## 2014-12-30 ENCOUNTER — Other Ambulatory Visit: Payer: Self-pay | Admitting: *Deleted

## 2014-12-30 DIAGNOSIS — E039 Hypothyroidism, unspecified: Secondary | ICD-10-CM

## 2014-12-30 MED ORDER — LEVOTHYROXINE SODIUM 88 MCG PO TABS: 88.0000 ug | ORAL_TABLET | Freq: Every day | ORAL | Status: DC

## 2014-12-30 NOTE — Telephone Encounter (Signed)
Refill request

## 2014-12-31 MED ORDER — LEVOTHYROXINE SODIUM 88 MCG PO TABS: 88.0000 ug | ORAL_TABLET | Freq: Every day | ORAL | Status: DC

## 2014-12-31 MED ORDER — SUMATRIPTAN SUCCINATE 100 MG PO TABS: ORAL_TABLET | ORAL | Status: AC

## 2015-01-27 ENCOUNTER — Telehealth: Payer: Self-pay | Admitting: Gastroenterology

## 2015-01-27 NOTE — Telephone Encounter (Signed)
Patient has no preference for new GI MD. States she has had a sore throat when she wakes up for about a month. States she had this in the past and was told it is GERD. She has tried Nexium and Prilosec without relief. Schedule with Nicoletta Ba, PA on 01/28/15 at 9:00 AM.

## 2015-01-28 ENCOUNTER — Encounter: Payer: Self-pay | Admitting: Physician Assistant

## 2015-01-28 ENCOUNTER — Ambulatory Visit (INDEPENDENT_AMBULATORY_CARE_PROVIDER_SITE_OTHER): Payer: BLUE CROSS/BLUE SHIELD | Admitting: Physician Assistant

## 2015-01-28 VITALS — BP 100/64 | HR 68 | Ht 64.0 in | Wt 132.0 lb

## 2015-01-28 DIAGNOSIS — K2100 Gastro-esophageal reflux disease with esophagitis, without bleeding: Secondary | ICD-10-CM

## 2015-01-28 MED ORDER — PANTOPRAZOLE SODIUM 40 MG PO TBEC: 40.0000 mg | DELAYED_RELEASE_TABLET | Freq: Two times a day (BID) | ORAL | Status: DC

## 2015-01-28 MED ORDER — SUCRALFATE 1 GM/10ML PO SUSP: ORAL | Status: DC

## 2015-01-28 NOTE — Patient Instructions (Signed)
We sent prescriptions to [facility]. 1. Carafate 1 gram 2. Protonix 40 mg  We have given you information about Reflux and heartburn.

## 2015-01-28 NOTE — Progress Notes (Addendum)
Patient ID: Laura Mann, female   DOB: 09-Jul-1966, 49 y.o.   MRN: [id number]   Subjective:    Patient ID: Laura Mann, female    DOB: [birth year], 49 y.o.   MRN: [id number]  HPI Laura Mann is a pleasant 49 year old white female known previously to Dr. Sharlett Mann. She had undergone colonoscopy in September 2014 which was a normal exam she had random biopsies done which were also negative. She has been diagnosed with IBS. Patient also had EGD in September 2014 with finding of a moderate hiatal hernia Schatzki's ring and flattened duodenal folds. Small bowel biopsies were done and these were unremarkable. Agent comes in today with symptoms of increasing heartburn and acid reflux. She says she has had intermittent heartburn over the past couple of years but had not really required any regular medication. Now over the past month or so she has been having more persistent problems with early morning sore throat and sometimes waking up in the middle of the night with a tickle in her throat.'s developed some hoarseness and a lump sensation in her throat as well. She has no complaints of dysphagia or odynophagia. He has been experiencing heartburn. She started taking Nexium over-the-counter about 3 weeks ago and says that this has not made a significant difference. Says last night she thought about sleeping in the recliner cut she seems to feel better upright.  Review of Systems Pertinent positive and negative review of systems were noted in the above HPI section.  All other review of systems was otherwise negative.  Outpatient Encounter Prescriptions as of 01/28/2015  Medication Sig  . diazepam (VALIUM) 5 MG tablet TAKE 1 TABLET BY MOUTH AT BEDTIME AS NEEDED  . fluticasone (FLONASE) 50 MCG/ACT nasal spray Place 2 sprays into the nose daily as needed.  . hyoscyamine (LEVSIN SL) 0.125 MG SL tablet Place 1 tablet (0.125 mg total) under the tongue every 4 (four) hours as needed for cramping.  Marland Kitchen ibuprofen  (ADVIL,MOTRIN) 800 MG tablet 800 mg every 6 (six) hours as needed. Take one tablet twice a day prior to menses  . levothyroxine (SYNTHROID, LEVOTHROID) 88 MCG tablet Take 1 tablet (88 mcg total) by mouth daily before breakfast.  . metroNIDAZOLE (METROGEL) 1 % gel Apply 1 application topically daily. To face   . Multiple Vitamin (MULTIVITAMIN) tablet Take 1 tablet by mouth daily.  . SUMAtriptan (IMITREX) 100 MG tablet TAKE ONE TABLET AT ONSET OF HEADACHE. MAY REPEAT ONE TIME ONLY IN 2 HOURS prn  . pantoprazole (PROTONIX) 40 MG tablet Take 1 tablet (40 mg total) by mouth 2 (two) times daily.  . sucralfate (CARAFATE) 1 GM/10ML suspension Take 1 gram 3 times daily between meals.  . [DISCONTINUED] Fe Fum-FePoly-Vit C-Vit B3 (INTEGRA) 62.5-62.5-40-3 MG CAPS Take one daily  Give generic   Allergies  Allergen Reactions  . Codeine Other (See Comments)    dizziness   Patient Active Problem List   Diagnosis Date Noted  . Right lateral epicondylitis 09/17/2014  . Anemia, iron deficiency 08/21/2014  . Metatarsalgia of both feet 05/22/2014  . Heel spur 05/22/2014  . Metatarsal deformity 04/24/2014  . Other hammer toe (acquired) 04/24/2014  . Pain in lower limb 04/24/2014  . Anemia 04/22/2014  . Insomnia 04/22/2014  . Situational stress 04/22/2014  . Migraine 04/04/2013  . Allergic rhinitis 07/18/2012  . Anxiety   . Headache   . Depression   . Rosacea   . Hypothyroidism    History   Social History  .  Marital Status: Married    Spouse Name: Laura Mann  . Number of Children: 2  . Years of Education: Master's   Occupational History  . Occupational therapist    Social History Main Topics  . Smoking status: Never Smoker   . Smokeless tobacco: Never Used  . Alcohol Use: No  . Drug Use: No  . Sexual Activity:    Partners: Male   Other Topics Concern  . Not on file   Social History Narrative    Ms. Mathurin's family history includes Celiac disease in her daughter; Colon polyps in her  maternal grandmother; Dementia in her father; Depression in her father; Diabetes in her father; Diabetes Mellitus I in her daughter; Heart disease in her father; Irritable bowel syndrome in her mother; Melanoma in her father and mother; Other in her daughter; Stroke in her maternal grandfather; Uterine cancer in her maternal grandmother. There is no history of Colon cancer, Esophageal cancer, Pancreatic cancer, Kidney disease, or Liver disease.      Objective:    Filed Vitals:   01/28/15 0913  BP: 100/64  Pulse: 68    Physical Exam    well-developed white female in no acute distress, pleasant blood pressure 100/64 pulse 68 height 5 foot 4 weight 132. HEENT ;nontraumatic normocephalic EOMI PERRLA sclera anicteric, Supple; no JVD, Cardiovascular ;regular rate and rhythm with S1-S2 no murmur or gallop, Pulmonary clear bilaterally, Abdomen ;soft and nontender, nondistended, bowel sounds are active there is no palpable mass or hepatosplenomegaly, Rectal; exam not done, Extremities; no clubbing cyanosis or edema skin warm and dry, Psych; mood and affect appropriate        Assessment & Plan:   #1  49 yo female with hiatal hernia and intermittent GERD sxs now with persistent daily heartburn, hoarseness, and "fullness" in throat x one month- unresponsive to OTC PPI.  Sxs are consistent with GERD with esophagitis.  #2 colon screening- negative colonoscopy 2014- follow up at 10 year interval.  Plan; Antireflux  regimen reviewed and reading material provided.  Advised NPO for 3 hours before bed and elevation of HOB at least 45 degrees Start Protonix 40 mg BID x one month ,then decrease to 40 mg ac dinner Carafate liquid 1 gm 3 x daily between meals and bedtime x 2 weeks Follow up with dr.Pyrtle or myself as needed   Laura Mann Genia Harold PA-C 01/28/2015   Cc: Coralyn Mark, Altamese Cabal, *  Addendum: Reviewed and agree with initial management. Jerene Bears, MD

## 2015-04-11 ENCOUNTER — Telehealth: Payer: Self-pay | Admitting: Physician Assistant

## 2015-04-11 NOTE — Telephone Encounter (Signed)
Patient is still having some symptoms but they are improving. She has made diet changes and followed the "reflux" precautions. She does not have daily symptoms. She will continue the Protonix 40 mg daily for now. She agrees to call back in 2 weeks with her progress. If still symptomatic, she agrees to come back in.

## 2015-04-14 NOTE — Telephone Encounter (Signed)
Ok that's fine

## 2015-04-15 ENCOUNTER — Telehealth: Payer: Self-pay | Admitting: Physician Assistant

## 2015-04-15 NOTE — Telephone Encounter (Signed)
Last week she said she was experiencing some symptoms. She does not like the idea of long term PPI, but was willing to give it a little more time. But cost is playing into this too. How do you feel about Omeprazole 20 mg BID?

## 2015-04-15 NOTE — Telephone Encounter (Signed)
She can take omeprazole 40 mg po qam. For 3-4 weeks then stop and see how she does

## 2015-04-16 ENCOUNTER — Other Ambulatory Visit: Payer: Self-pay

## 2015-04-16 MED ORDER — OMEPRAZOLE 40 MG PO CPDR: 40.0000 mg | DELAYED_RELEASE_CAPSULE | Freq: Every day | ORAL | Status: DC

## 2015-04-16 NOTE — Telephone Encounter (Signed)
Spoke with the patient. She found out the pharmacy had not run the Pantoprazole prescription through her insurance. She had purchased the Carafate and the Pantoprazole out of pocket. She is in touch with her insurance regarding reimbursement. She also asks for an appointment with Dr Hilarie Fredrickson. Her husband is an Therapist, sports and has encouraged her to do this. Appointment scheduled for 1st available on 05/19/15, but will watch for cancellations and try to move the appointment up. Patient expresses understanding. She wants to be able to come off of the PPI due to the fatigue and headaches she gets from them. She continues to be very careful with her diet and has blocked up her bed. She does feel this has helped but is not yet 100% better. She will continue the Pantoprazole for now.

## 2015-04-17 ENCOUNTER — Encounter: Payer: Self-pay | Admitting: *Deleted

## 2015-04-18 ENCOUNTER — Ambulatory Visit (INDEPENDENT_AMBULATORY_CARE_PROVIDER_SITE_OTHER): Payer: BLUE CROSS/BLUE SHIELD | Admitting: Internal Medicine

## 2015-04-18 ENCOUNTER — Other Ambulatory Visit (INDEPENDENT_AMBULATORY_CARE_PROVIDER_SITE_OTHER): Payer: BLUE CROSS/BLUE SHIELD

## 2015-04-18 ENCOUNTER — Encounter: Payer: Self-pay | Admitting: Internal Medicine

## 2015-04-18 VITALS — BP 110/74 | HR 64 | Temp 98.1°F | Resp 14 | Ht 64.0 in | Wt 124.1 lb

## 2015-04-18 DIAGNOSIS — K219 Gastro-esophageal reflux disease without esophagitis: Secondary | ICD-10-CM | POA: Diagnosis not present

## 2015-04-18 DIAGNOSIS — R5383 Other fatigue: Secondary | ICD-10-CM | POA: Diagnosis not present

## 2015-04-18 DIAGNOSIS — D509 Iron deficiency anemia, unspecified: Secondary | ICD-10-CM | POA: Diagnosis not present

## 2015-04-18 DIAGNOSIS — E039 Hypothyroidism, unspecified: Secondary | ICD-10-CM

## 2015-04-18 LAB — CBC
HCT: 37.5 % (ref 36.0–46.0)
Hemoglobin: 12.4 g/dL (ref 12.0–15.0)
MCHC: 33.2 g/dL (ref 30.0–36.0)
MCV: 86.6 fl (ref 78.0–100.0)
Platelets: 239 10*3/uL (ref 150.0–400.0)
RBC: 4.33 Mil/uL (ref 3.87–5.11)
RDW: 13.2 % (ref 11.5–15.5)
WBC: 4 10*3/uL (ref 4.0–10.5)

## 2015-04-18 LAB — COMPREHENSIVE METABOLIC PANEL
ALT: 19 U/L (ref 0–35)
AST: 21 U/L (ref 0–37)
Albumin: 4.7 g/dL (ref 3.5–5.2)
Alkaline Phosphatase: 57 U/L (ref 39–117)
BUN: 9 mg/dL (ref 6–23)
CO2: 30 mEq/L (ref 19–32)
Calcium: 9.3 mg/dL (ref 8.4–10.5)
Chloride: 101 mEq/L (ref 96–112)
Creatinine, Ser: 0.7 mg/dL (ref 0.40–1.20)
GFR: 94.79 mL/min (ref >60.00)
Glucose, Bld: 98 mg/dL (ref 70–99)
Potassium: 3.5 mEq/L (ref 3.5–5.1)
Sodium: 138 mEq/L (ref 135–145)
Total Bilirubin: 1.2 mg/dL (ref 0.2–1.2)
Total Protein: 7.5 g/dL (ref 6.0–8.3)

## 2015-04-18 LAB — TSH: TSH: 0.22 u[IU]/mL — ABNORMAL LOW (ref 0.35–4.50)

## 2015-04-18 LAB — T4, FREE: Free T4: 1.51 ng/dL (ref 0.60–1.60)

## 2015-04-18 NOTE — Assessment & Plan Note (Signed)
She will be going to a digestive doctor at [facility] to consult with them. Talked with her about dosing to wean off the omeprazole (which she has now instead of pantoprazole due to insurance issue). She will take 1 pill 2 of 3 days for 2 weeks then decrease to every other day for 2-3 weeks then call back for further instructions. Advised to continue with her lifestyle changes including elevation of her bed frame and changing her diet.

## 2015-04-18 NOTE — Assessment & Plan Note (Signed)
Recheck CBC for her new fatigue. Last Hg 11.8 and no new bleeding symptoms but she is still getting her cycles.

## 2015-04-18 NOTE — Patient Instructions (Signed)
We will check your blood work and call you back with the results.   Keep the appointment with the digestive health center as it sounds like it could be very helpful.   I would recommend to keep with the 20 mg omeprazole for the next 2 weeks, then if you are doing well you can skip a dose every third day (take it two days in a row then skip one day). If you are doing well with that for 2-3 weeks you can decrease to every other day with the medicine. If that is going well call us back for the next instructions. If you start having symptoms go back up to the last dose that was working.   Come back in about 6 months if the thyroid is doing well, if the levels are not good we would like to see you back in 3 months.   Call our office with any problems or questions.

## 2015-04-18 NOTE — Assessment & Plan Note (Signed)
Recheck TSH and free T4 to ensure dosing is adequate. Adjust as needed. Synthroid 88 mcg daily currently.

## 2015-04-18 NOTE — Progress Notes (Signed)
Pre visit review using our clinic review tool, if applicable. No additional management support is needed unless otherwise documented below in the visit note. 

## 2015-04-18 NOTE — Progress Notes (Signed)
   Subjective:    Patient ID: Laura Mann, female    DOB: 03-15-1966, 49 y.o.   MRN: [id number]  HPI The patient is a 49 YO female who is new and coming in about her GERD. She had been taking pantoprazole which was helping. She did stop abruptly since she was doing better and had severe recurrent symptoms with stopping the medicine. She went back on the medicine and the symptoms are gone. She has also made a lot of lifestyle changes that she hopes will be enough that she can wean down on her medicine. She has elevated her bed and changed her diet. She is also softening the foods so that her esophagus can heal. She was noted to have hiatal hernia on EGD within the last several years.   PMH, [facility], social history reviewed and updated with the patient.  Review of Systems  Constitutional: Positive for activity change. Negative for fever, chills, appetite change, fatigue and unexpected weight change.  HENT: Negative.   Eyes: Negative.   Respiratory: Negative for cough, chest tightness, shortness of breath and wheezing.   Cardiovascular: Negative for chest pain, palpitations and leg swelling.  Gastrointestinal: Negative for nausea, abdominal pain, diarrhea, constipation and abdominal distention.       GERD symptoms of hoarseness  Musculoskeletal: Negative.   Skin: Negative.   Neurological: Negative.   Psychiatric/Behavioral: Negative.       Objective:   Physical Exam  Constitutional: She is oriented to person, place, and time. She appears well-developed and well-nourished.  HENT:  Head: Normocephalic and atraumatic.  Eyes: EOM are normal.  Neck: Normal range of motion.  Cardiovascular: Normal rate and regular rhythm.   Pulmonary/Chest: Effort normal and breath sounds normal.  Abdominal: Soft. Bowel sounds are normal.  Musculoskeletal: She exhibits no edema.  Neurological: She is alert and oriented to person, place, and time. Coordination normal.  Skin: Skin is warm and dry.    Psychiatric: She has a normal mood and affect.   Filed Vitals:   04/18/15 1002  BP: 110/74  Pulse: 64  Temp: 98.1 F (36.7 C)  TempSrc: Oral  Resp: 14  Height: 5\' 4"  (1.626 m)  Weight: 124 lb 1.9 oz (56.3 kg)  SpO2: 98%      Assessment & Plan:

## 2015-04-22 ENCOUNTER — Encounter: Payer: Self-pay | Admitting: Family

## 2015-04-22 ENCOUNTER — Encounter: Payer: Self-pay | Admitting: Internal Medicine

## 2015-04-22 ENCOUNTER — Telehealth: Payer: Self-pay | Admitting: Internal Medicine

## 2015-04-22 DIAGNOSIS — R5383 Other fatigue: Secondary | ICD-10-CM

## 2015-04-22 NOTE — Telephone Encounter (Signed)
Should we put in orders, or does this patient need to come in and be seen?

## 2015-04-22 NOTE — Telephone Encounter (Signed)
Patient was in to see Dr. Doug Sou last week and had some lab work done. She has been having some symptoms of shortness of breath, fatigue, heart flutters. (not to the point she has to go to er). She feels these symptoms could be caused by low magnesium. She noticed that magnesium wasn't on her lab work and she is just wondering if she could get a lab order to test it.

## 2015-04-22 NOTE — Telephone Encounter (Signed)
She will need to be seen in the office or go to Urgent Care or the ED.

## 2015-04-23 NOTE — Telephone Encounter (Signed)
Patient did not want to go to urgent care or ED.  She did want to come in soon to review meds and labs in regards to symptoms.  I have scheduled patient for Friday 6/3 with Dr. Linna Darner.

## 2015-04-23 NOTE — Telephone Encounter (Signed)
Left message on home and cell for patient to call back.

## 2015-04-25 ENCOUNTER — Ambulatory Visit: Payer: BLUE CROSS/BLUE SHIELD | Admitting: Internal Medicine

## 2015-04-30 ENCOUNTER — Ambulatory Visit: Payer: BLUE CROSS/BLUE SHIELD | Admitting: Internal Medicine

## 2015-05-12 ENCOUNTER — Encounter: Payer: Self-pay | Admitting: Internal Medicine

## 2015-05-12 DIAGNOSIS — E039 Hypothyroidism, unspecified: Secondary | ICD-10-CM

## 2015-05-19 ENCOUNTER — Ambulatory Visit: Payer: BLUE CROSS/BLUE SHIELD | Admitting: Internal Medicine

## 2015-06-05 ENCOUNTER — Encounter: Payer: Self-pay | Admitting: Internal Medicine

## 2015-06-20 ENCOUNTER — Encounter: Payer: Self-pay | Admitting: Family

## 2015-06-20 ENCOUNTER — Ambulatory Visit (INDEPENDENT_AMBULATORY_CARE_PROVIDER_SITE_OTHER): Payer: BLUE CROSS/BLUE SHIELD | Admitting: Family

## 2015-06-20 ENCOUNTER — Other Ambulatory Visit (INDEPENDENT_AMBULATORY_CARE_PROVIDER_SITE_OTHER): Payer: BLUE CROSS/BLUE SHIELD

## 2015-06-20 VITALS — BP 118/74 | HR 73 | Temp 98.2°F | Resp 18 | Ht 64.0 in | Wt 122.4 lb

## 2015-06-20 DIAGNOSIS — E039 Hypothyroidism, unspecified: Secondary | ICD-10-CM | POA: Diagnosis not present

## 2015-06-20 DIAGNOSIS — K219 Gastro-esophageal reflux disease without esophagitis: Secondary | ICD-10-CM

## 2015-06-20 DIAGNOSIS — J029 Acute pharyngitis, unspecified: Secondary | ICD-10-CM

## 2015-06-20 LAB — VITAMIN B12: Vitamin B-12: 453 pg/mL (ref 211–911)

## 2015-06-20 LAB — TSH: TSH: 0.23 u[IU]/mL — ABNORMAL LOW (ref 0.35–4.50)

## 2015-06-20 LAB — MAGNESIUM: Magnesium: 1.9 mg/dL (ref 1.5–2.5)

## 2015-06-20 NOTE — Patient Instructions (Signed)
Thank you for choosing [facility].  Summary/Instructions:  Please follow up GI.  Continue to take medications as prescribed.  Please stop at the lab for your TSH.  Your prescription(s) have been submitted to your pharmacy or been printed and provided for you. Please take as directed and contact our office if you believe you are having problem(s) with the medication(s) or have any questions.  If your symptoms worsen or fail to improve, please contact our office for further instruction, or in case of emergency go directly to the emergency room at the closest medical facility.

## 2015-06-20 NOTE — Progress Notes (Signed)
Pre visit review using our clinic review tool, if applicable. No additional management support is needed unless otherwise documented below in the visit note. 

## 2015-06-20 NOTE — Assessment & Plan Note (Signed)
Previous TSH was low with current dosage of levothyroxine. Obtain TSH. Continue current dosage of levothyroxine pending TSH results.

## 2015-06-20 NOTE — Progress Notes (Signed)
Subjective:    Patient ID: Laura Mann, female    DOB: 11-09-1966, 49 y.o.   MRN: [id number]  Chief Complaint  Patient presents with  . Follow-up    needs TSH rechecked, Wants her throat checked, she is continuing to have sore throat    HPI:  Laura Mann is a 49 y.o. female with a PMH of migraines, hypothyroidism, anxiety, anemia,who presents today for a follow up office visit.   1.) Hypothyroidism - Currently maintained on levothyroxine 88 mcg daily. Takes her medication as prescribed and denies adverse side effects.   Lab Results  Component Value Date   TSH 0.23* 06/20/2015    2.) Sore throat - Associated symptom of a sore throat has been going on for about 3 months. Previously treated for GERD with pantoprazole and had fairly significant improvement. She discontinued to the medication in May and experienced a rebound  Reflux. She was placed back on the pantoprazole and there was question about an inflammatory condition. She has been scheduled to see a rheumatologist. Notes that she is now on 80 mg of pantoprazole daily and is experiencing the associated symptom of sore throat.  Current modifying factors include pantoprazole and sucralfate. She has been followed by Dr. Collene Mares of gastroenterology   Allergies  Allergen Reactions  . Codeine Other (See Comments)    dizziness    Current Outpatient Prescriptions on File Prior to Visit  Medication Sig Dispense Refill  . diazepam (VALIUM) 5 MG tablet TAKE 1 TABLET BY MOUTH AT BEDTIME AS NEEDED 20 tablet 1  . fluticasone (FLONASE) 50 MCG/ACT nasal spray Place 2 sprays into the nose daily as needed.    . hyoscyamine (LEVSIN SL) 0.125 MG SL tablet Place 1 tablet (0.125 mg total) under the tongue every 4 (four) hours as needed for cramping. 30 tablet 1  . ibuprofen (ADVIL,MOTRIN) 800 MG tablet 800 mg every 6 (six) hours as needed. Take one tablet twice a day prior to menses    . levothyroxine (SYNTHROID, LEVOTHROID) 88 MCG  tablet Take 1 tablet (88 mcg total) by mouth daily before breakfast. 90 tablet 2  . metroNIDAZOLE (METROGEL) 1 % gel Apply 1 application topically daily. To face     . Multiple Vitamin (MULTIVITAMIN) tablet Take 1 tablet by mouth daily.    . sucralfate (CARAFATE) 1 GM/10ML suspension Take 1 gram 3 times daily between meals. 420 mL 1  . SUMAtriptan (IMITREX) 100 MG tablet TAKE ONE TABLET AT ONSET OF HEADACHE. MAY REPEAT ONE TIME ONLY IN 2 HOURS prn 10 tablet 2   No current facility-administered medications on file prior to visit.    Review of Systems  Constitutional: Negative for fever and chills.  HENT: Positive for sore throat.   Respiratory: Negative for chest tightness, shortness of breath and wheezing.   Gastrointestinal: Negative for nausea, vomiting, abdominal pain, diarrhea and constipation.      Objective:    BP 118/74 mmHg  Pulse 73  Temp(Src) 98.2 F (36.8 C) (Oral)  Resp 18  Ht 5\' 4"  (1.626 m)  Wt 122 lb 6.4 oz (55.52 kg)  BMI 21.00 kg/m2  SpO2 97% Nursing note and vital signs reviewed.  Physical Exam  Constitutional: She is oriented to person, place, and time. She appears well-developed and well-nourished. No distress.  HENT:  Mouth/Throat: Uvula is midline, oropharynx is clear and moist and mucous membranes are normal.  Cardiovascular: Normal rate, regular rhythm, normal heart sounds and intact distal pulses.   Pulmonary/Chest:  Effort normal and breath sounds normal.  Neurological: She is alert and oriented to person, place, and time.  Skin: Skin is warm and dry.  Psychiatric: She has a normal mood and affect. Her behavior is normal. Judgment and thought content normal.       Assessment & Plan:   Problem List Items Addressed This Visit      Digestive   GERD (gastroesophageal reflux disease) - Primary    Symptoms of sore throat or most likely related to esophagitis from her GERD. Appears to be flaring at this time as her current medications are Protonix and  Carafate are minimally controlling her symptoms. Continue current dosages of Protonix and Carafate. Recommend follow-up with GI for possible repeat endoscopy. Obtain magnesium and B12. Follow-up with primary care symptoms worsen or fail to improve.      Relevant Medications   pantoprazole (PROTONIX) 40 MG tablet   Other Relevant Orders   B12 (Completed)   Magnesium (Completed)     Endocrine   Hypothyroidism    Previous TSH was low with current dosage of levothyroxine. Obtain TSH. Continue current dosage of levothyroxine pending TSH results.      Relevant Orders   TSH (Completed)

## 2015-06-20 NOTE — Assessment & Plan Note (Addendum)
Symptoms of sore throat or most likely related to esophagitis from her GERD. Appears to be flaring at this time as her current medications are Protonix and Carafate are minimally controlling her symptoms. Continue current dosages of Protonix and Carafate. Recommend follow-up with GI for possible repeat endoscopy. Obtain magnesium and B12. Follow-up with primary care symptoms worsen or fail to improve.

## 2015-06-25 MED ORDER — LEVOTHYROXINE SODIUM 75 MCG PO TABS: 75.0000 ug | ORAL_TABLET | Freq: Every day | ORAL | Status: DC

## 2015-06-30 ENCOUNTER — Telehealth: Payer: Self-pay | Admitting: Internal Medicine

## 2015-06-30 NOTE — Telephone Encounter (Signed)
Rec'd from [facility] forward 8 pages to Dr.Prytle

## 2015-06-30 NOTE — Telephone Encounter (Signed)
Received records from Dr. Collene Mares and placed on Dr. Vena Rua desk for review.  Pt is wanting to switch because she said she saw Nicoletta Ba in May 2015 and was not satisfied.  She said she felt like Amy Esterwood did not educate her well on her meds that were prescribed and Amy Trellis Paganini was not educated enough.

## 2015-07-02 NOTE — Addendum Note (Signed)
Addended by: Mauricio Po D on: 07/02/2015 09:01 PM   Modules accepted: Orders

## 2015-07-07 NOTE — Telephone Encounter (Signed)
Per Dr Hilarie Fredrickson, he is unable to accept patient into his practice at this time. She has now been well established with Dr Collene Mares (has seen her 3 times) and should continue care with her.

## 2015-08-22 ENCOUNTER — Other Ambulatory Visit: Payer: Commercial Managed Care - PPO

## 2015-08-22 ENCOUNTER — Ambulatory Visit (INDEPENDENT_AMBULATORY_CARE_PROVIDER_SITE_OTHER): Payer: Commercial Managed Care - PPO | Admitting: Internal Medicine

## 2015-08-22 ENCOUNTER — Other Ambulatory Visit (INDEPENDENT_AMBULATORY_CARE_PROVIDER_SITE_OTHER): Payer: Commercial Managed Care - PPO

## 2015-08-22 ENCOUNTER — Encounter: Payer: Self-pay | Admitting: Internal Medicine

## 2015-08-22 VITALS — BP 116/70 | HR 80 | Temp 98.5°F | Ht 64.0 in | Wt 114.0 lb

## 2015-08-22 DIAGNOSIS — R5383 Other fatigue: Secondary | ICD-10-CM | POA: Insufficient documentation

## 2015-08-22 DIAGNOSIS — R7989 Other specified abnormal findings of blood chemistry: Secondary | ICD-10-CM | POA: Insufficient documentation

## 2015-08-22 DIAGNOSIS — M2550 Pain in unspecified joint: Secondary | ICD-10-CM | POA: Diagnosis not present

## 2015-08-22 DIAGNOSIS — R634 Abnormal weight loss: Secondary | ICD-10-CM | POA: Diagnosis not present

## 2015-08-22 DIAGNOSIS — Z23 Encounter for immunization: Secondary | ICD-10-CM

## 2015-08-22 LAB — BASIC METABOLIC PANEL
BUN: 12 mg/dL (ref 6–23)
CO2: 32 mEq/L (ref 19–32)
Calcium: 9.2 mg/dL (ref 8.4–10.5)
Chloride: 101 mEq/L (ref 96–112)
Creatinine, Ser: 0.75 mg/dL (ref 0.40–1.20)
GFR: 87.41 mL/min (ref >60.00)
Glucose, Bld: 91 mg/dL (ref 70–99)
Potassium: 3.8 mEq/L (ref 3.5–5.1)
Sodium: 138 mEq/L (ref 135–145)

## 2015-08-22 LAB — URINALYSIS, ROUTINE W REFLEX MICROSCOPIC
Bilirubin Urine: NEGATIVE
Hgb urine dipstick: NEGATIVE
Ketones, ur: NEGATIVE
Leukocytes, UA: NEGATIVE
Mucus, UA: NONE SEEN — AB
Nitrite: NEGATIVE
RBC / HPF: NONE SEEN (ref 0–?)
Specific Gravity, Urine: <=1.005 — AB (ref 1.000–1.030)
Total Protein, Urine: NEGATIVE
Urine Glucose: NEGATIVE
Urobilinogen, UA: 0.2 (ref 0.0–1.0)
WBC, UA: NONE SEEN (ref 0–?)
pH: 7 (ref 5.0–8.0)

## 2015-08-22 LAB — TSH: TSH: 1.21 u[IU]/mL (ref 0.35–4.50)

## 2015-08-22 LAB — HEPATIC FUNCTION PANEL
ALT: 25 U/L (ref 0–35)
AST: 20 U/L (ref 0–37)
Albumin: 4.4 g/dL (ref 3.5–5.2)
Alkaline Phosphatase: 56 U/L (ref 39–117)
Bilirubin, Direct: 0.3 mg/dL (ref 0.0–0.3)
Total Bilirubin: 1.5 mg/dL — ABNORMAL HIGH (ref 0.2–1.2)
Total Protein: 7 g/dL (ref 6.0–8.3)

## 2015-08-22 LAB — CBC WITH DIFFERENTIAL/PLATELET
Basophils Absolute: 0 10*3/uL (ref 0.0–0.1)
Basophils Relative: 0.5 % (ref 0.0–3.0)
Eosinophils Absolute: 0 10*3/uL (ref 0.0–0.7)
Eosinophils Relative: 0.8 % (ref 0.0–5.0)
HCT: 32.9 % — ABNORMAL LOW (ref 36.0–46.0)
Hemoglobin: 10.9 g/dL — ABNORMAL LOW (ref 12.0–15.0)
Lymphocytes Relative: 31.7 % (ref 12.0–46.0)
Lymphs Abs: 1.6 10*3/uL (ref 0.7–4.0)
MCHC: 33.1 g/dL (ref 30.0–36.0)
MCV: 85.9 fl (ref 78.0–100.0)
Monocytes Absolute: 0.4 10*3/uL (ref 0.1–1.0)
Monocytes Relative: 8.8 % (ref 3.0–12.0)
Neutro Abs: 2.9 10*3/uL (ref 1.4–7.7)
Neutrophils Relative %: 58.2 % (ref 43.0–77.0)
Platelets: 229 10*3/uL (ref 150.0–400.0)
RBC: 3.84 Mil/uL — ABNORMAL LOW (ref 3.87–5.11)
RDW: 14 % (ref 11.5–15.5)
WBC: 5 10*3/uL (ref 4.0–10.5)

## 2015-08-22 LAB — IBC PANEL
Iron: 108 ug/dL (ref 42–145)
Saturation Ratios: 24 % (ref 20.0–50.0)
Transferrin: 322 mg/dL (ref 212.0–360.0)

## 2015-08-22 LAB — MAGNESIUM: Magnesium: 2.1 mg/dL (ref 1.5–2.5)

## 2015-08-22 LAB — T4, FREE: Free T4: 1.1 ng/dL (ref 0.60–1.60)

## 2015-08-22 LAB — RHEUMATOID FACTOR: Rhuematoid fact SerPl-aCnc: <10 IU/mL (ref <=14)

## 2015-08-22 LAB — CK: Total CK: 93 U/L (ref 7–177)

## 2015-08-22 LAB — H. PYLORI ANTIBODY, IGG: H Pylori IgG: NEGATIVE

## 2015-08-22 LAB — SEDIMENTATION RATE: Sed Rate: 13 mm/hr (ref 0–22)

## 2015-08-22 NOTE — Assessment & Plan Note (Signed)
Exam benign, but cant r/o other - for rheum labs,  to f/u any worsening symptoms or concerns

## 2015-08-22 NOTE — Assessment & Plan Note (Signed)
I suspect diet change related, but for cxr

## 2015-08-22 NOTE — Assessment & Plan Note (Signed)
For f/u labs with recent thyroid med change,  to f/u any worsening symptoms or concerns

## 2015-08-22 NOTE — Progress Notes (Signed)
Subjective:    Patient ID: Laura Mann, female    DOB: 07/13/1966, 49 y.o.   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  Wt then about 122 lbs, overall feels she has lost about 15 lbs this past 6 months, did have thyroid med changed a few mo ago for low tsh, not check since  This past wk with achy, fatigue, malaise, wondering about protonix 80 mg and zantac 300 mg side effects.  Wondering about interferenc with her thyroid med.  Has hx of IBS, now wondering about small bowel bacterial overgrowth.  Wondering if needs to be on probiotic, has read extensively online about LPR.  Has appt in fu with GI at [facility] Dr Newman Pies.  Also suggested maybe she has an uderlying rheum syndrome, or even FMS.. Pt denies fever,  night sweats, loss of appetite, or other constitutional symptoms.  Denies urinary symptoms such as dysuria, frequency, urgency, flank pain, hematuria or n/v, fever, chills. Works in hospital, no known exposure to c diff.  Her stools more loose than diarrhea, not foul smelling, but may be somewhat off but hard to describe.   Wt has been down, but has changed her diet as well, very bland diet, lower carb and low fat.  Wt Readings from Last 3 Encounters:  08/22/15 114 lb (51.71 kg)  06/20/15 122 lb 6.4 oz (55.52 kg)  04/18/15 124 lb 1.9 oz (56.3 kg)  Daughter with type I DM and brain tumor hx since 2010, more stress related to this recently, pt had not worked x 6 yrs, now with new job since aug 2016 (husband staying home currently with daughter with needs).  Does also have some achiness overall . Pt asks for labs today.  Past Medical History  Diagnosis Date  . Headache(784.0)     migraine  . Rosacea   . Depression   . Hypothyroidism     . Hashimoto thyroiditis   . Anxiety   . Arthritis   . Schatzki's ring 2014  . GERD (gastroesophageal reflux disease)   . IBS (irritable bowel syndrome)   . Hiatal hernia   . Hiatal hernia    Past Surgical History  Procedure Laterality Date  . No past surgeries      reports that she has never smoked. She has never used smokeless tobacco. She reports that she does not drink alcohol or use illicit drugs. family history includes Anxiety disorder in her father and mother; Celiac disease in her daughter; Colon polyps in her maternal grandmother; Dementia in her father; Depression in her father and mother; Diabetes in her father; Diabetes Mellitus I in her daughter; Heart disease in her father; Irritable bowel syndrome in her mother; Melanoma in her father and mother; Mitral valve prolapse in her mother; Osteoarthritis in her mother; Osteopenia in her mother; Other in her daughter; Stroke in her maternal grandfather; Uterine cancer in her maternal grandmother. There is no history of Colon cancer, Esophageal cancer, Pancreatic cancer, Kidney disease, or Liver disease. Allergies  Allergen Reactions  . Codeine Other (See Comments)    dizziness   Review of Systems  Constitutional: Negative for unusual diaphoresis or night sweats HENT: Negative for ringing in  ear or discharge Eyes: Negative for double vision or worsening visual disturbance.  Respiratory: Negative for choking and stridor.   Gastrointestinal: Negative for vomiting or other signifcant bowel change Genitourinary: Negative for hematuria or change in urine volume.  Musculoskeletal: Negative for other MSK pain or swelling Skin: Negative for color change and worsening wound.  Neurological: Negative for tremors and numbness other than noted  Psychiatric/Behavioral: Negative for decreased concentration or agitation other than above       Objective:   Physical Exam BP 116/70 mmHg  Pulse 80  Temp(Src) 98.5 F (36.9 C) (Oral)  Ht 5'  4" (1.626 m)  Wt 114 lb (51.71 kg)  BMI 19.56 kg/m2  SpO2 97% VS noted,  Constitutional: Pt appears in no significant distress HENT: Head: NCAT.  Right Ear: External ear normal.  Left Ear: External ear normal.  Eyes: . Pupils are equal, round, and reactive to light. Conjunctivae and EOM are normal Neck: Normal range of motion. Neck supple.  Cardiovascular: Normal rate and regular rhythm.   Pulmonary/Chest: Effort normal and breath sounds without rales or wheezing.  Abd:  Soft, NT, ND, + BS Neurological: Pt is alert. Not confused , motor grossly intact Skin: Skin is warm. No rash, no LE edema, no joint effusions or tenderness Psychiatric: Pt behavior is normal. No agitation.     Assessment & Plan:

## 2015-08-22 NOTE — Progress Notes (Signed)
Pre visit review using our clinic review tool, if applicable. No additional management support is needed unless otherwise documented below in the visit note. 

## 2015-08-22 NOTE — Assessment & Plan Note (Addendum)
Etiology unclear, Exam otherwise benign, to check labs as documented, follow with expectant management  Note:  Total time for pt hx, exam, review of record with pt in the room, determination of diagnoses and plan for further eval and tx is > 40 min, with over 50% spent in coordination and counseling of patient  

## 2015-08-22 NOTE — Patient Instructions (Signed)
Please continue all other medications as before, and refills have been done if requested.  Please have the pharmacy call with any other refills you may need.  Please continue your efforts at being more active, low cholesterol diet, and weight control.  You are otherwise up to date with prevention measures today.  Please keep your appointments with your specialists as you may have planned - GI  Please go to the XRAY Department in the Basement (go straight as you get off the elevator) for the x-ray testing  Please go to the LAB in the Basement (turn left off the elevator) for the tests to be done today  You will be contacted by phone if any changes need to be made immediately.  Otherwise, you will receive a letter about your results with an explanation, but please check with MyChart first.  Please remember to sign up for MyChart if you have not done so, as this will be important to you in the future with finding out test results, communicating by private email, and scheduling acute appointments online when needed.

## 2015-08-25 LAB — ANTI-DNA ANTIBODY, DOUBLE-STRANDED: ds DNA Ab: <1 IU/mL

## 2015-08-25 LAB — ANA: Anti Nuclear Antibody(ANA): NEGATIVE

## 2015-08-25 LAB — VITAMIN D 25 HYDROXY (VIT D DEFICIENCY, FRACTURES): VITD: 23.35 ng/mL — ABNORMAL LOW (ref 30.00–100.00)

## 2015-08-26 ENCOUNTER — Ambulatory Visit (HOSPITAL_BASED_OUTPATIENT_CLINIC_OR_DEPARTMENT_OTHER)
Admission: RE | Admit: 2015-08-26 | Discharge: 2015-08-26 | Disposition: A | Discharge status: Home / Self Care | Payer: Commercial Managed Care - PPO | Source: Ambulatory Visit | Attending: Internal Medicine | Admitting: Internal Medicine

## 2015-08-26 DIAGNOSIS — R634 Abnormal weight loss: Secondary | ICD-10-CM | POA: Insufficient documentation

## 2015-08-26 DIAGNOSIS — R0789 Other chest pain: Secondary | ICD-10-CM | POA: Diagnosis not present

## 2015-09-17 ENCOUNTER — Other Ambulatory Visit: Payer: Self-pay | Admitting: Family

## 2015-11-14 IMAGING — CR DG CHEST 2V
2 series · 2 of 2 positions shown · non-contrast
Comparison: None.

CLINICAL DATA: Weight loss with chest tightness on inspiration,
initial encounter

EXAM:
CHEST - 2 VIEW

[w chest pa]
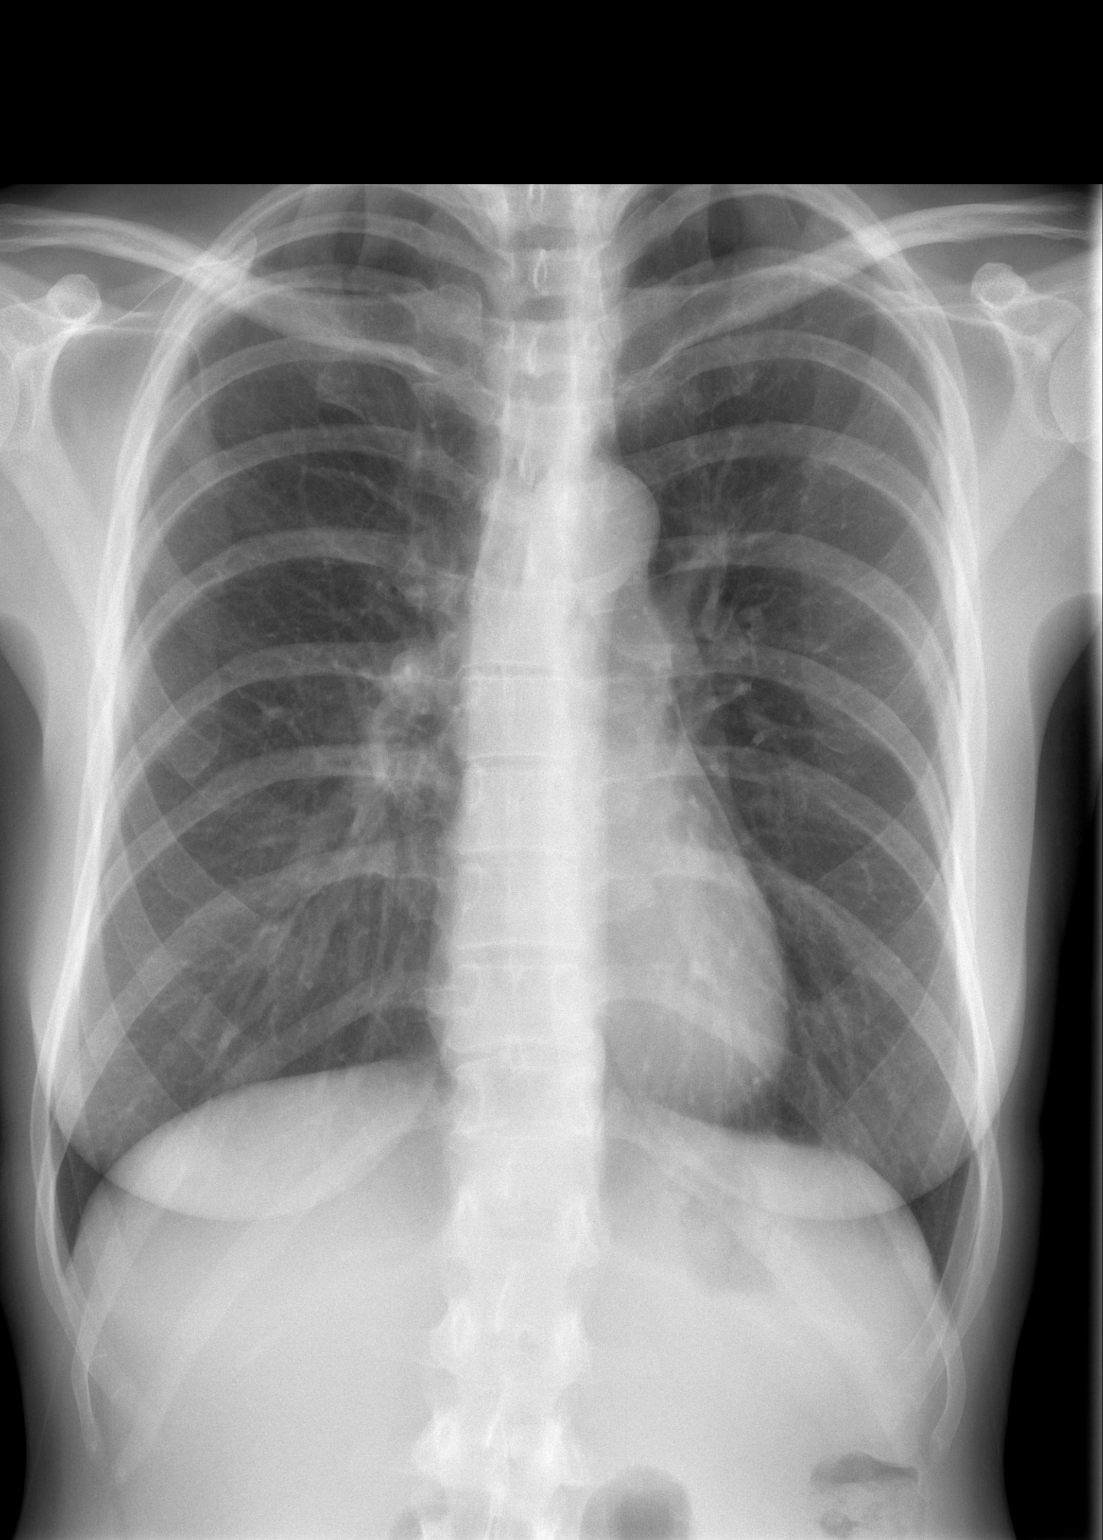

[w chest lat]
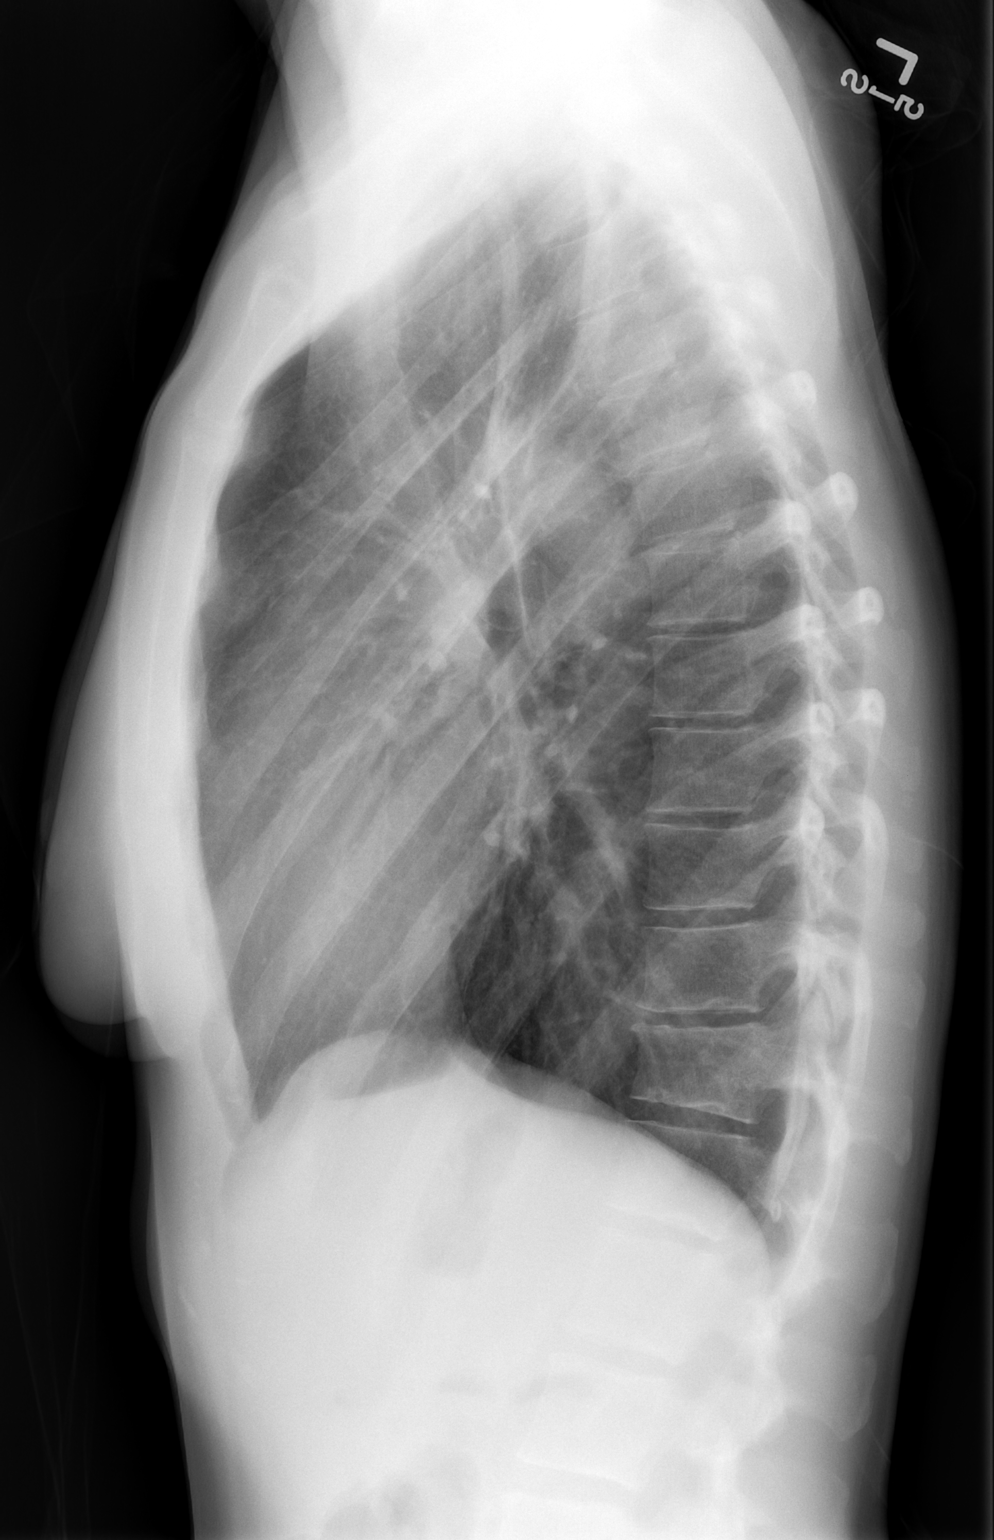

[2 of 2 positions shown; findings below may reference images not displayed]

FINDINGS: The heart size and mediastinal contours are within normal limits.
Both lungs are clear. The visualized skeletal structures are
unremarkable.
IMPRESSION: No active disease.

## 2016-01-10 ENCOUNTER — Other Ambulatory Visit: Payer: Self-pay | Admitting: Family

## 2016-04-24 ENCOUNTER — Encounter (HOSPITAL_BASED_OUTPATIENT_CLINIC_OR_DEPARTMENT_OTHER): Payer: Self-pay | Admitting: *Deleted

## 2016-04-24 ENCOUNTER — Emergency Department (HOSPITAL_BASED_OUTPATIENT_CLINIC_OR_DEPARTMENT_OTHER)
Admission: EM | Admit: 2016-04-24 | Discharge: 2016-04-24 | Disposition: A | Discharge status: Home / Self Care | Payer: BLUE CROSS/BLUE SHIELD | Attending: Emergency Medicine | Admitting: Emergency Medicine

## 2016-04-24 DIAGNOSIS — R55 Syncope and collapse: Secondary | ICD-10-CM

## 2016-04-24 DIAGNOSIS — R109 Unspecified abdominal pain: Secondary | ICD-10-CM

## 2016-04-24 DIAGNOSIS — R5383 Other fatigue: Secondary | ICD-10-CM | POA: Diagnosis present

## 2016-04-24 DIAGNOSIS — F329 Major depressive disorder, single episode, unspecified: Secondary | ICD-10-CM | POA: Insufficient documentation

## 2016-04-24 DIAGNOSIS — E039 Hypothyroidism, unspecified: Secondary | ICD-10-CM | POA: Insufficient documentation

## 2016-04-24 DIAGNOSIS — R197 Diarrhea, unspecified: Secondary | ICD-10-CM | POA: Insufficient documentation

## 2016-04-24 DIAGNOSIS — D649 Anemia, unspecified: Secondary | ICD-10-CM | POA: Diagnosis not present

## 2016-04-24 DIAGNOSIS — R1013 Epigastric pain: Secondary | ICD-10-CM | POA: Insufficient documentation

## 2016-04-24 LAB — CBC WITH DIFFERENTIAL/PLATELET
Basophils Absolute: 0 10*3/uL (ref 0.0–0.1)
Basophils Relative: 0 %
Eosinophils Absolute: 0.1 10*3/uL (ref 0.0–0.7)
Eosinophils Relative: 1 %
HCT: 30.5 % — ABNORMAL LOW (ref 36.0–46.0)
Hemoglobin: 9.6 g/dL — ABNORMAL LOW (ref 12.0–15.0)
Lymphocytes Relative: 15 %
Lymphs Abs: 1 10*3/uL (ref 0.7–4.0)
MCH: 27.7 pg (ref 26.0–34.0)
MCHC: 31.5 g/dL (ref 30.0–36.0)
MCV: 88.2 fL (ref 78.0–100.0)
Monocytes Absolute: 0.5 10*3/uL (ref 0.1–1.0)
Monocytes Relative: 7 %
Neutro Abs: 5.2 10*3/uL (ref 1.7–7.7)
Neutrophils Relative %: 77 %
Platelets: 190 10*3/uL (ref 150–400)
RBC: 3.46 MIL/uL — ABNORMAL LOW (ref 3.87–5.11)
RDW: 14.9 % (ref 11.5–15.5)
WBC: 6.7 10*3/uL (ref 4.0–10.5)

## 2016-04-24 LAB — URINALYSIS, ROUTINE W REFLEX MICROSCOPIC
Bilirubin Urine: NEGATIVE
Glucose, UA: NEGATIVE mg/dL
Ketones, ur: NEGATIVE mg/dL
Leukocytes, UA: NEGATIVE
Nitrite: NEGATIVE
Protein, ur: NEGATIVE mg/dL
Specific Gravity, Urine: 1.007 (ref 1.005–1.030)
pH: 6.5 (ref 5.0–8.0)

## 2016-04-24 LAB — LIPASE, BLOOD: Lipase: 16 U/L (ref 11–51)

## 2016-04-24 LAB — COMPREHENSIVE METABOLIC PANEL
ALT: 24 U/L (ref 14–54)
AST: 26 U/L (ref 15–41)
Albumin: 3.5 g/dL (ref 3.5–5.0)
Alkaline Phosphatase: 54 U/L (ref 38–126)
Anion gap: 6 (ref 5–15)
BUN: 14 mg/dL (ref 6–20)
CO2: 27 mmol/L (ref 22–32)
Calcium: 8.5 mg/dL — ABNORMAL LOW (ref 8.9–10.3)
Chloride: 105 mmol/L (ref 101–111)
Creatinine, Ser: 0.58 mg/dL (ref 0.44–1.00)
GFR calc Af Amer: >60 mL/min (ref >60)
GFR calc non Af Amer: >60 mL/min (ref >60)
Glucose, Bld: 107 mg/dL — ABNORMAL HIGH (ref 65–99)
Potassium: 3.8 mmol/L (ref 3.5–5.1)
Sodium: 138 mmol/L (ref 135–145)
Total Bilirubin: 1.3 mg/dL — ABNORMAL HIGH (ref 0.3–1.2)
Total Protein: 6.6 g/dL (ref 6.5–8.1)

## 2016-04-24 LAB — URINE MICROSCOPIC-ADD ON

## 2016-04-24 LAB — PREGNANCY, URINE: Preg Test, Ur: NEGATIVE

## 2016-04-24 LAB — OCCULT BLOOD X 1 CARD TO LAB, STOOL: Fecal Occult Bld: POSITIVE — AB

## 2016-04-24 MED ORDER — SODIUM CHLORIDE 0.9 % IV BOLUS (SEPSIS)
1000.0000 mL | Freq: Once | INTRAVENOUS | Status: AC
  Administered 2016-04-24: 1000 mL via INTRAVENOUS

## 2016-04-24 MED ORDER — FLUTICASONE PROPIONATE 50 MCG/ACT NA SUSP: 2.0000 | Freq: Every day | NASAL | Status: DC

## 2016-04-24 NOTE — Discharge Instructions (Signed)
Syncope °Syncope is a medical term for fainting or passing out. This means you lose consciousness and drop to the ground. People are generally unconscious for less than 5 minutes. You may have some muscle twitches for up to 15 seconds before waking up and returning to normal. Syncope occurs more often in older adults, but it can happen to anyone. While most causes of syncope are not dangerous, syncope can be a sign of a serious medical problem. It is important to seek medical care.  °CAUSES  °Syncope is caused by a sudden drop in blood flow to the brain. The specific cause is often not determined. Factors that can bring on syncope include: °· Taking medicines that lower blood pressure. °· Sudden changes in posture, such as standing up quickly. °· Taking more medicine than prescribed. °· Standing in one place for too long. °· Seizure disorders. °· Dehydration and excessive exposure to heat. °· Low blood sugar (hypoglycemia). °· Straining to have a bowel movement. °· Heart disease, irregular heartbeat, or other circulatory problems. °· Fear, emotional distress, seeing blood, or severe pain. °SYMPTOMS  °Right before fainting, you may: °· Feel dizzy or light-headed. °· Feel nauseous. °· See all white or all black in your field of vision. °· Have cold, clammy skin. °DIAGNOSIS  °Your health care provider will ask about your symptoms, perform a physical exam, and perform an electrocardiogram (ECG) to record the electrical activity of your heart. Your health care provider may also perform other heart or blood tests to determine the cause of your syncope which may include: °· Transthoracic echocardiogram (TTE). During echocardiography, sound waves are used to evaluate how blood flows through your heart. °· Transesophageal echocardiogram (TEE). °· Cardiac monitoring. This allows your health care provider to monitor your heart rate and rhythm in real time. °· Holter monitor. This is a portable device that records your  heartbeat and can help diagnose heart arrhythmias. It allows your health care provider to track your heart activity for several days, if needed. °· Stress tests by exercise or by giving medicine that makes the heart beat faster. °TREATMENT  °In most cases, no treatment is needed. Depending on the cause of your syncope, your health care provider may recommend changing or stopping some of your medicines. °HOME CARE INSTRUCTIONS °· Have someone stay with you until you feel stable. °· Do not drive, use machinery, or play sports until your health care provider says it is okay. °· Keep all follow-up appointments as directed by your health care provider. °· Lie down right away if you start feeling like you might faint. Breathe deeply and steadily. Wait until all the symptoms have passed. °· Drink enough fluids to keep your urine clear or pale yellow. °· If you are taking blood pressure or heart medicine, get up slowly and take several minutes to sit and then stand. This can reduce dizziness. °SEEK IMMEDIATE MEDICAL CARE IF:  °· You have a severe headache. °· You have unusual pain in the chest, abdomen, or back. °· You are bleeding from your mouth or rectum, or you have black or tarry stool. °· You have an irregular or very fast heartbeat. °· You have pain with breathing. °· You have repeated fainting or seizure-like jerking during an episode. °· You faint when sitting or lying down. °· You have confusion. °· You have trouble walking. °· You have severe weakness. °· You have vision problems. °If you fainted, call your local emergency services (911 in [place] Do not drive   yourself to the hospital.    This information is not intended to replace advice given to you by your health care provider. Make sure you discuss any questions you have with your health care provider.   Document Released: 11/08/2005 Document Revised: 03/25/2015 Document Reviewed: 01/07/2012 Elsevier Interactive Patient Education 2016 [employer].  Anemia, Nonspecific Anemia is a condition in which the concentration of red blood cells or hemoglobin in the blood is below normal. Hemoglobin is a substance in red blood cells that carries oxygen to the tissues of the body. Anemia results in not enough oxygen reaching these tissues.  CAUSES  Common causes of anemia include:   Excessive bleeding. Bleeding may be internal or external. This includes excessive bleeding from periods (in women) or from the intestine.   Poor nutrition.   Chronic kidney, thyroid, and liver disease.  Bone marrow disorders that decrease red blood cell production.  Cancer and treatments for cancer.  HIV, AIDS, and their treatments.  Spleen problems that increase red blood cell destruction.  Blood disorders.  Excess destruction of red blood cells due to infection, medicines, and autoimmune disorders. SIGNS AND SYMPTOMS   Minor weakness.   Dizziness.   Headache.  Palpitations.   Shortness of breath, especially with exercise.   Paleness.  Cold sensitivity.  Indigestion.  Nausea.  Difficulty sleeping.  Difficulty concentrating. Symptoms may occur suddenly or they may develop slowly.  DIAGNOSIS  Additional blood tests are often needed. These help your health care provider determine the best treatment. Your health care provider will check your stool for blood and look for other causes of blood loss.  TREATMENT  Treatment varies depending on the cause of the anemia. Treatment can include:   Supplements of iron, vitamin 123456, or folic acid.   Hormone medicines.   A blood transfusion. This may be needed if blood loss is severe.   Hospitalization. This may be needed if there is significant continual blood loss.   Dietary changes.  Spleen removal. HOME CARE INSTRUCTIONS Keep all follow-up appointments. It often takes many weeks to correct anemia, and having your health care provider check on your condition and your response  to treatment is very important. SEEK IMMEDIATE MEDICAL CARE IF:   You develop extreme weakness, shortness of breath, or chest pain.   You become dizzy or have trouble concentrating.  You develop heavy vaginal bleeding.   You develop a rash.   You have bloody or black, tarry stools.   You faint.   You vomit up blood.   You vomit repeatedly.   You have abdominal pain.  You have a fever or persistent symptoms for more than 2-3 days.   You have a fever and your symptoms suddenly get worse.   You are dehydrated.  MAKE SURE YOU:  Understand these instructions.  Will watch your condition.  Will get help right away if you are not doing well or get worse.   This information is not intended to replace advice given to you by your health care provider. Make sure you discuss any questions you have with your health care provider.   Document Released: 12/16/2004 Document Revised: 07/11/2013 Document Reviewed: 05/04/2013 Elsevier Interactive Patient Education [employer].

## 2016-04-24 NOTE — ED Notes (Signed)
Attempted EKG x2. Patient shivering. Placed warm blankets and RN notified.

## 2016-04-24 NOTE — ED Provider Notes (Signed)
CSN: [id number]     Arrival date & time 04/24/16  1111 History   First MD Initiated Contact with Patient 04/24/16 1143     Chief Complaint  Patient presents with  . Fatigue  . Abdominal Pain    Laura Mann is a 50 y.o. female with a history of IBS who presents to the ED after three syncopal episodes today. The patient reports she was seen at her doctors office 2 days ago for nasal congestion, facial pain and chills. She is prescribed Augmentin. She has a history of IBS and this typically upsets her stomach. She reports today after taking her third dose of Augmentin she began having abdominal pain and cramping and had to sit on the toilet. She reports having multiple loose stools with abdominal cramping on the toilet. She reports she began to hyperventilate and subsequently passed out while sitting on the toilet. The patient's husband was nearby and came to her side. She was out for approximately 10 seconds. No seizure-like activity. No postictal period. Patient fell forwards onto carpeted ground. Patient then was assisted upright and had another syncopal episode after standing. She reports a third syncopal episode after returning to the toilet to have additional bowel movements. She reports that currently she is feeling much better. She is no longer nauseated. She has no abdominal pain. She reports feeling fatigued. She reports she has passed out previously after a bout of IBS while taking an antibiotic. She reports being very sensitive to antibiotics. She received 4 mg of Zofran by EMS prior to arrival with relief of her nausea. Besides taking the antibiotic she has taken nothing for treatment of her symptoms today. No antipyretics today. She denies chest pain, hematemesis, hematochezia, urinary symptoms, sore throat, trouble swallowing, fevers, neck pain, back pain, numbness, tingling, weakness, Chest pain, shortness of breath, or rashes.   Patient is a 50 y.o. female presenting with abdominal  pain. The history is provided by the patient and the spouse. No language interpreter was used.  Abdominal Pain Associated symptoms: diarrhea, fatigue, nausea and vomiting   Associated symptoms: no chest pain, no chills, no cough, no dysuria, no fever, no hematuria, no shortness of breath and no sore throat     Past Medical History  Diagnosis Date  . Headache(784.0)     migraine  . Rosacea   . Depression   . Hypothyroidism   . Hashimoto thyroiditis   . Anxiety   . Arthritis   . Schatzki's ring 2014  . GERD (gastroesophageal reflux disease)   . IBS (irritable bowel syndrome)   . Hiatal hernia   . Hiatal hernia    Past Surgical History  Procedure Laterality Date  . No past surgeries     Family History  Problem Relation Age of Onset  . Depression Father   . Melanoma Father   . Heart disease Father   . Diabetes Father   . Dementia Father   . Anxiety disorder Father   . Melanoma Mother   . Irritable bowel syndrome Mother   . Osteoarthritis Mother   . Osteopenia Mother   . Mitral valve prolapse Mother   . Depression Mother   . Anxiety disorder Mother   . Stroke Maternal Grandfather   . Uterine cancer Maternal Grandmother   . Colon polyps Maternal Grandmother   . Other Daughter     brain tumor  . Celiac disease Daughter   . Diabetes Mellitus I Daughter     insulin dependant  .  Colon cancer Neg Hx   . Esophageal cancer Neg Hx   . Pancreatic cancer Neg Hx   . Kidney disease Neg Hx   . Liver disease Neg Hx    Social History  Substance Use Topics  . Smoking status: Never Smoker   . Smokeless tobacco: Never Used  . Alcohol Use: No   OB History    Gravida Para Term Preterm AB TAB SAB Ectopic Multiple Living   3 2   1           Review of Systems  Constitutional: Positive for fatigue. Negative for fever and chills.  HENT: Positive for congestion and sinus pressure. Negative for ear discharge, ear pain, nosebleeds, sore throat, trouble swallowing and voice change.    Eyes: Negative for visual disturbance.  Respiratory: Negative for cough and shortness of breath.   Cardiovascular: Negative for chest pain and palpitations.  Gastrointestinal: Positive for nausea, vomiting, abdominal pain and diarrhea. Negative for blood in stool.  Genitourinary: Negative for dysuria, urgency, frequency, hematuria, flank pain, difficulty urinating and pelvic pain.  Musculoskeletal: Negative for back pain and neck pain.  Skin: Negative for rash.  Neurological: Negative for dizziness, weakness, light-headedness, numbness and headaches.      Allergies  Codeine  Home Medications   Prior to Admission medications   Medication Sig Start Date End Date Taking? Authorizing Provider  diazepam (VALIUM) 5 MG tablet TAKE 1 TABLET BY MOUTH AT BEDTIME AS NEEDED Patient not taking: Reported on 08/22/2015 11/11/14   Lanice Shirts, MD  fluticasone (FLONASE) 50 MCG/ACT nasal spray Place 2 sprays into both nostrils daily. 04/24/16   Waynetta Pean, PA-C  hyoscyamine (LEVSIN SL) 0.125 MG SL tablet Place 1 tablet (0.125 mg total) under the tongue every 4 (four) hours as needed for cramping. 07/26/13   Sable Feil, MD  ibuprofen (ADVIL,MOTRIN) 800 MG tablet 800 mg every 6 (six) hours as needed. Take one tablet twice a day prior to menses 06/17/11   Lanice Shirts, MD  levothyroxine (SYNTHROID, LEVOTHROID) 75 MCG tablet TAKE 1 TABLET (75 MCG TOTAL) BY MOUTH DAILY BEFORE BREAKFAST. 01/12/16   Hoyt Koch, MD  metroNIDAZOLE (METROGEL) 1 % gel Apply 1 application topically daily. To face     Historical Provider, MD  Multiple Vitamin (MULTIVITAMIN) tablet Take 1 tablet by mouth daily.    Historical Provider, MD  pantoprazole (PROTONIX) 40 MG tablet Take 40 mg by mouth daily.    Historical Provider, MD  ranitidine (ZANTAC) 300 MG tablet Take 300 mg by mouth. 07/15/15 10/13/15  Historical Provider, MD  sucralfate (CARAFATE) 1 GM/10ML suspension Take 1 gram 3 times daily between  meals. Patient taking differently: as needed. Take 1 gram 3 times daily between meals. 01/28/15   Amy S Esterwood, PA-C  SUMAtriptan (IMITREX) 100 MG tablet TAKE ONE TABLET AT ONSET OF HEADACHE. MAY REPEAT ONE TIME ONLY IN 2 HOURS prn 12/31/14   Lanice Shirts, MD   BP 108/67 mmHg  Pulse 80  Temp(Src) 98.4 F (36.9 C) (Oral)  Resp 16  Ht 5\' 4"  (1.626 m)  Wt 58.968 kg  BMI 22.30 kg/m2  SpO2 98% Physical Exam  Constitutional: She is oriented to person, place, and time. She appears well-developed and well-nourished. No distress.  Nontoxic appearing.  HENT:  Head: Normocephalic and atraumatic.  Right Ear: External ear normal.  Left Ear: External ear normal.  Mouth/Throat: Oropharynx is clear and moist. No oropharyngeal exudate.  Bilateral tympanic membranes are pearly-gray without erythema  or loss of landmarks.  Throat is clear. No sinus tenderness to palpation.   Eyes: Conjunctivae and EOM are normal. Pupils are equal, round, and reactive to light. Right eye exhibits no discharge. Left eye exhibits no discharge.  Neck: Normal range of motion. Neck supple. No JVD present.  Cardiovascular: Normal rate, regular rhythm, normal heart sounds and intact distal pulses.  Exam reveals no gallop and no friction rub.   No murmur heard. Pulmonary/Chest: Effort normal and breath sounds normal. No stridor. No respiratory distress. She has no wheezes. She has no rales.  Lungs are clear to auscultation bilaterally.  Abdominal: Soft. Bowel sounds are normal. She exhibits no distension. There is tenderness. There is no guarding.  Mild epigastric tenderness to palpation. Abdomen is otherwise nontender to palpation. No peritoneal signs. No psoas or operator sign. Bowel sounds are present. Abdomen is soft. No CVA or flank tenderness.  Genitourinary:  Digital rectal exam performed by me with female RN chaperone. Anal sphincter tone is normal. No gross bloody stool. Stool is brown.  Musculoskeletal: She  exhibits no edema or tenderness.  Lymphadenopathy:    She has no cervical adenopathy.  Neurological: She is alert and oriented to person, place, and time. No cranial nerve deficit. Coordination normal.  The patient is alert and oriented 3. Cranial nerves are intact. Speech is clear and coherent. Vision is grossly intact. Strength is good in her bilateral upper and lower extremities.  Skin: Skin is warm and dry. No rash noted. She is not diaphoretic. No erythema. No pallor.  Psychiatric: She has a normal mood and affect. Her behavior is normal.  Nursing note and vitals reviewed.   ED Course  Procedures (including critical care time) Labs Review Labs Reviewed  COMPREHENSIVE METABOLIC PANEL - Abnormal; Notable for the following:    Glucose, Bld 107 (*)    Calcium 8.5 (*)    Total Bilirubin 1.3 (*)    All other components within normal limits  CBC WITH DIFFERENTIAL/PLATELET - Abnormal; Notable for the following:    RBC 3.46 (*)    Hemoglobin 9.6 (*)    HCT 30.5 (*)    All other components within normal limits  URINALYSIS, ROUTINE W REFLEX MICROSCOPIC (NOT AT [facility]) - Abnormal; Notable for the following:    Hgb urine dipstick LARGE (*)    All other components within normal limits  OCCULT BLOOD X 1 CARD TO LAB, STOOL - Abnormal; Notable for the following:    Fecal Occult Bld POSITIVE (*)    All other components within normal limits  URINE MICROSCOPIC-ADD ON - Abnormal; Notable for the following:    Squamous Epithelial / LPF 0-5 (*)    Bacteria, UA RARE (*)    All other components within normal limits  LIPASE, BLOOD  PREGNANCY, URINE    Imaging Review No results found. I have personally reviewed and evaluated these images and lab results as part of my medical decision-making.   EKG Interpretation   Date/Time:  Saturday April 24 2016 12:56:12 EDT Ventricular Rate:  68 PR Interval:  137 QRS Duration: 81 QT Interval:  368 QTC Calculation: 391 R Axis:   72 Text Interpretation:   Sinus rhythm Anteroseptal infarct, old Borderline  repolarization abnormality agree. no STEMI. Confirmed by Johnney Killian, MD,  [place] [phone]) on 04/24/2016 3:46:34 PM      Filed Vitals:   04/24/16 1129 04/24/16 1344 04/24/16 1459  BP: 107/63  108/67  Pulse: 70 72 80  Temp: 98.4 F (36.9 C)  TempSrc: Oral    Resp: 16 15 16   Height: 5\' 4"  (1.626 m)    Weight: 58.968 kg    SpO2: 100% 100% 98%     MDM   Meds given in ED:  Medications  sodium chloride 0.9 % bolus 1,000 mL (0 mLs Intravenous Stopped 04/24/16 1229)    Discharge Medication List as of 04/24/2016  3:53 PM      Final diagnoses:  Syncope and collapse  Abdominal cramping  Diarrhea, unspecified type  Anemia, unspecified anemia type   This is a 50 y.o. female with a history of IBS who presents to the ED after three syncopal episodes today. The patient reports she was seen at her doctors office 2 days ago for nasal congestion, facial pain and chills. She is prescribed Augmentin. She has a history of IBS and this typically upsets her stomach. She reports today after taking her third dose of Augmentin she began having abdominal pain and cramping and had to sit on the toilet. She reports having multiple loose stools with abdominal cramping on the toilet. She reports she began to hyperventilate and subsequently passed out while sitting on the toilet. The patient's husband was nearby and came to her side. She was out for approximately 10 seconds. No seizure-like activity. No postictal period. Patient fell forwards onto carpeted ground. Patient then was assisted upright and had another syncopal episode after standing. She reports a third syncopal episode after returning to the toilet to have additional bowel movements. She reports that currently she is feeling much better. She is no longer nauseated. She has no abdominal pain. She reports feeling fatigued. She reports she has passed out previously after a bout of IBS while taking an antibiotic.  She reports being very sensitive to antibiotics. On exam the patient is afebrile nontoxic appearing. She is some mild epigastric tenderness to palpation. Her abdomen is otherwise soft and nontender. She has no focal neurological deficits. On digital rectal exam she has no gross bloody stool. She is not orthostatic. Urinalysis is unremarkable. She is a negative pregnancy test. CMP is remarkable only for a total bilirubin of 1.3. Lipase is within normal limits. CBC is remarkable for hemoglobin of 9.6. No leukocytosis. The patient reports she is recently off her menstrual cycle and she had a very heavily menses. She reports she is being treated for some anemia by her primary care doctor and is supposed to be taking iron. She reports she has not been taking iron recently. She further denies any hematemesis or hematochezia. No dark stool. She had no gross bloody stool on exam. Her guaiac was positive. After fluid bolus patient reports she is feeling much better. She does not feel lightheaded with position change. She is not feeling short of breath. We discussed these test results and the possibility that she could have bleeding in her gastrointestinal tract. Her anemia may also be related to her heavy menses. I believe her syncopal episode was due to her bowel movements on the toilet today with abdominal cramping. As well as the hyperventilation. Patient reports feeling well and ready for discharge. I will have her to discontinue her Augmentin as this is greatly upset her stomach recently. I believe this could contribute to another emergency department visit if she continues to take this. I do not believe she requires an antibiotic for sinusitis which was ongoing for 2 days. She is still complains of some mild pain to her right sinuses. Will prescribe fluticasone nasal spray and have her  continue using Claritin. Patient also reports she can use a Neti pot. Also encouraged her to follow-up with her dentist as she  has pain around her teeth. I encouraged her to take her iron as prescribed. I encouraged close follow-up with her primary care this week. I encouraged him to recheck her hemoglobin and hematocrit. I advised that she has any bloody stool, feels lightheaded, dizzy or short of breath she needs to return to the emergency department for reevaluation. I discussed strict and specific return precautions. I advised the patient to follow-up with their primary care provider this week. I advised the patient to return to the emergency department with new or worsening symptoms or new concerns. The patient verbalized understanding and agreement with plan.    This patient was discussed with Dr. Colvin Caroli who agrees with assessment and plan.   Waynetta Pean, PA-C 04/24/16 1631  Charlesetta Shanks, MD 05/05/16 1556

## 2016-04-24 NOTE — ED Notes (Signed)
Patient c/o abd pain, nausea, syncope earlier today. She saw an MD on Thursday and was prescribed augmentin . She states that she has IBS and has been experiencing nausea and abd cramping since earlier this morning. She states that while having a BM this morning she passed out. She had a migraine yesterday and took sumatriptan which relieved her migraine. She has not been eating or drinking as she normally does. She was given 4mg  zofran by EMS which relieved her nausea.

## 2019-02-08 ENCOUNTER — Ambulatory Visit: Payer: Commercial Managed Care - PPO | Admitting: Family Medicine

## 2019-03-15 ENCOUNTER — Telehealth: Payer: Self-pay

## 2019-03-15 NOTE — Telephone Encounter (Signed)
Pt called statting that her husband is a Psychologist, sport and exercise and had to go in to work on a pt. Pt's daughter has may illness that make her high risk. Currently the pt and daughter are quarantining away for pt's husband.  Pt was wanting to know if there is any community test for Covid. Pt informed that the ER is doing testing only if you have to b trated for SOB or difficulty breathing or other complications that coincide with covid-19 symptoms. Pt is a pt of [facility].

## 2019-07-23 ENCOUNTER — Other Ambulatory Visit: Payer: Self-pay

## 2019-07-24 ENCOUNTER — Ambulatory Visit (INDEPENDENT_AMBULATORY_CARE_PROVIDER_SITE_OTHER): Payer: BC Managed Care – PPO | Admitting: Family Medicine

## 2019-07-24 ENCOUNTER — Encounter: Payer: Self-pay | Admitting: Family Medicine

## 2019-07-24 VITALS — BP 104/68 | HR 78 | Temp 97.7°F | Ht 64.0 in | Wt 133.5 lb

## 2019-07-24 DIAGNOSIS — G43109 Migraine with aura, not intractable, without status migrainosus: Secondary | ICD-10-CM | POA: Diagnosis not present

## 2019-07-24 DIAGNOSIS — R232 Flushing: Secondary | ICD-10-CM | POA: Diagnosis not present

## 2019-07-24 MED ORDER — HYOSCYAMINE SULFATE 0.125 MG SL SUBL: 0.1250 mg | SUBLINGUAL_TABLET | SUBLINGUAL | 1 refills | Status: AC | PRN

## 2019-07-24 NOTE — Progress Notes (Signed)
Chief Complaint  Patient presents with  . New Patient (Initial Visit)  . Hot Flashes  . Migraine    once a week       New Patient Visit SUBJECTIVE: HPI: Laura Mann is an 53 y.o.female who is being seen for establishing care.  Patient has a history of hot flashes.  She has not tried any thing at home so far.  She would like to discuss this with both he and her GYN.  These episodes seem to come and go.  She does believe that climbing control in her home is satisfactory.  Patient has a history of migraines with aura.  She takes Imitrex and Excedrin as needed.  She will usually have 5-6 days of entire month with a headache.  She is not currently on a daily medication.  She was placed on venlafaxine by her neurologist in the past.  Allergies  Allergen Reactions  . Codeine Other (See Comments)    dizziness    Past Medical History:  Diagnosis Date  . Anxiety   . Arthritis   . Depression   . GERD (gastroesophageal reflux disease)   . Hashimoto thyroiditis   . Headache(784.0)    migraine  . Hiatal hernia   . Hiatal hernia   . Hypothyroidism   . IBS (irritable bowel syndrome)   . Rosacea   . Schatzki's ring 2014   Past Surgical History:  Procedure Laterality Date  . NO PAST SURGERIES     Family History  Problem Relation Age of Onset  . Depression Father   . Melanoma Father   . Heart disease Father   . Diabetes Father   . Dementia Father   . Anxiety disorder Father   . Melanoma Mother   . Irritable bowel syndrome Mother   . Osteoarthritis Mother   . Osteopenia Mother   . Mitral valve prolapse Mother   . Depression Mother   . Anxiety disorder Mother   . Stroke Maternal Grandfather   . Uterine cancer Maternal Grandmother   . Colon polyps Maternal Grandmother   . Other Daughter        brain tumor  . Celiac disease Daughter   . Diabetes Mellitus I Daughter        insulin dependant  . Colon cancer Neg Hx   . Esophageal cancer Neg Hx   . Pancreatic cancer  Neg Hx   . Kidney disease Neg Hx   . Liver disease Neg Hx    Allergies  Allergen Reactions  . Codeine Other (See Comments)    dizziness    Current Outpatient Medications:  .  hyoscyamine (LEVSIN SL) 0.125 MG SL tablet, Place 1 tablet (0.125 mg total) under the tongue every 4 (four) hours as needed for cramping., Disp: 30 tablet, Rfl: 1 .  levothyroxine (SYNTHROID) 88 MCG tablet, Take 88 mcg by mouth daily before breakfast., Disp: , Rfl:  .  metroNIDAZOLE (METROGEL) 1 % gel, Apply 1 application topically daily. To face , Disp: , Rfl:  .  Multiple Vitamin (MULTIVITAMIN) tablet, Take 1 tablet by mouth daily., Disp: , Rfl:  .  SUMAtriptan (IMITREX) 100 MG tablet, TAKE ONE TABLET AT ONSET OF HEADACHE. MAY REPEAT ONE TIME ONLY IN 2 HOURS prn, Disp: 10 tablet, Rfl: 2  ROS Neuro: Denies current headache  Endo: +hot flashes   OBJECTIVE: BP 104/68 (BP Location: Left Arm, Patient Position: Sitting, Cuff Size: Normal)   Pulse 78   Temp 97.7 F (36.5 C) (Temporal)  Ht 5\' 4"  (1.626 m)   Wt 133 lb 8 oz (60.6 kg)   SpO2 98%   BMI 22.92 kg/m   Constitutional: -  VS reviewed -  Well developed, well nourished, appears stated age -  No apparent distress  Psychiatric: -  Oriented to person, place, and time -  Memory intact -  Affect and mood normal -  Fluent conversation, good eye contact -  Judgment and insight age appropriate  Eye: -  Conjunctivae clear, no discharge -  Pupils symmetric, round, reactive to light  Cardiovascular: -  RRR -  No LE edema  Respiratory: -  Normal respiratory effort, no accessory muscle use, no retraction -  Breath sounds equal, no wheezes, no ronchi, no crackles  Neurological:  -  CN II - XII grossly intact -  DTR's equal and symmetric, no clonus, no cerebellar signs -  Sensation grossly intact to light touch, equal bilaterally  Musculoskeletal: -  No clubbing, no cyanosis -  Gait normal  Skin: -  No significant lesion on inspection -  Warm and dry to  palpation   ASSESSMENT/PLAN: Hot flashes  Migraine with aura and without status migrainosus, not intractable  Patient instructed to sign release of records form from her previous PCP. 1-recommended adequate temperature control/fan/closing, black cohosh versus soy, medication, and potentially discussing hormone replacement with her GYN. 2-we will continue Imitrex as needed.  I am okay with 5-6 days of the month without starting a prophylactic.  I did bring up the venlafaxine being an option for both 1 and 2.  We agreed to hold off for now. Patient should return in 3 mo for physical. The patient voiced understanding and agreement to the plan.   [place], DO 07/24/19  12:12 PM

## 2019-07-24 NOTE — Patient Instructions (Addendum)
I recommend getting the flu shot in mid October. This suggestion would change if the CDC comes out with a different recommendation.   Consider black cohosh and/or soy.  Venlefaxine/Effexor can be used for both hot flashes and frequent headaches.  Let us know if you need anything.

## 2019-11-26 ENCOUNTER — Encounter: Payer: Self-pay | Admitting: Family Medicine

## 2019-11-26 MED ORDER — LEVOTHYROXINE SODIUM 88 MCG PO TABS: 88.0000 ug | ORAL_TABLET | Freq: Every day | ORAL | 0 refills | Status: AC

## 2020-01-22 ENCOUNTER — Encounter: Payer: BC Managed Care – PPO | Admitting: Family Medicine
